# Patient Record
Sex: Female | Born: 1955 | Race: White | Hispanic: No | Marital: Single | Smoking: Never smoker
Health system: Southern US, Community
[De-identification: ages and names within clinical notes are randomized; demographics above are authoritative.]

## PROBLEM LIST (undated history)

## (undated) DIAGNOSIS — M810 Age-related osteoporosis without current pathological fracture: Secondary | ICD-10-CM

## (undated) HISTORY — PX: SKIN CANCER EXCISION: SHX779

## (undated) HISTORY — DX: Age-related osteoporosis without current pathological fracture: M81.0

---

## 2004-03-25 HISTORY — PX: COLONOSCOPY: SHX174

## 2013-04-14 ENCOUNTER — Ambulatory Visit: Payer: Self-pay | Admitting: Physician Assistant

## 2014-11-04 ENCOUNTER — Encounter: Payer: Self-pay | Admitting: Family Medicine

## 2014-11-04 ENCOUNTER — Ambulatory Visit (INDEPENDENT_AMBULATORY_CARE_PROVIDER_SITE_OTHER): Payer: BLUE CROSS/BLUE SHIELD | Admitting: Family Medicine

## 2014-11-04 VITALS — BP 110/64 | HR 76 | Ht 66.0 in | Wt 126.0 lb

## 2014-11-04 DIAGNOSIS — J019 Acute sinusitis, unspecified: Secondary | ICD-10-CM

## 2014-11-04 MED ORDER — FLUCONAZOLE 150 MG PO TABS: 150.0000 mg | ORAL_TABLET | Freq: Once | ORAL | Status: DC

## 2014-11-04 MED ORDER — AZITHROMYCIN 250 MG PO TABS: ORAL_TABLET | ORAL | Status: DC

## 2014-11-04 NOTE — Progress Notes (Addendum)
Name: Heather Sexton   MRN: [id number]    DOB: Jan 30, 1956   Date:11/18/2014       Progress Note  Subjective  Chief Complaint  Chief Complaint  Patient presents with  . Sinusitis    no voice x 1 week, congestion- yellow production. OTC not working    Sinusitis This is a new problem. The current episode started in the past 7 days. The problem has been waxing and waning since onset. There has been no fever. Associated symptoms include congestion, coughing, sinus pressure, a sore throat and swollen glands. Pertinent negatives include no chills, diaphoresis, ear pain, headaches, hoarse voice, neck pain, shortness of breath or sneezing. Treatments tried: antihistamine. The treatment provided no relief.    No problem-specific assessment & plan notes found for this encounter.   Past Medical History  Diagnosis Date  . Osteoporosis     Past Surgical History  Procedure Laterality Date  . Skin cancer excision    . Colonoscopy  2006    Family History  Problem Relation Age of Onset  . Diabetes Father     Social History   Social History  . Marital Status: Single    Spouse Name: N/A  . Number of Children: N/A  . Years of Education: N/A   Occupational History  . Not on file.   Social History Main Topics  . Smoking status: Never Smoker   . Smokeless tobacco: Not on file  . Alcohol Use: No  . Drug Use: No  . Sexual Activity: No   Other Topics Concern  . Not on file   Social History Narrative    Allergies  Allergen Reactions  . Erythromycin Base Rash     Review of Systems  Constitutional: Negative for fever, chills, weight loss, malaise/fatigue and diaphoresis.  HENT: Positive for congestion, sinus pressure and sore throat. Negative for ear discharge, ear pain, hoarse voice and sneezing.   Eyes: Negative for blurred vision.  Respiratory: Positive for cough. Negative for sputum production, shortness of breath and wheezing.   Cardiovascular: Negative for chest  pain, palpitations and leg swelling.  Gastrointestinal: Negative for heartburn, nausea, abdominal pain, diarrhea, constipation, blood in stool and melena.  Genitourinary: Negative for dysuria, urgency, frequency and hematuria.  Musculoskeletal: Negative for myalgias, back pain, joint pain and neck pain.  Skin: Negative for rash.  Neurological: Negative for dizziness, tingling, sensory change, focal weakness and headaches.  Endo/Heme/Allergies: Negative for environmental allergies and polydipsia. Does not bruise/bleed easily.  Psychiatric/Behavioral: Negative for depression and suicidal ideas. The patient is not nervous/anxious and does not have insomnia.      Objective  Filed Vitals:   11/04/14 1445  BP: 110/64  Pulse: 76  Height:  (1.676 m)  Weight: 126 lb (57.153 kg)    Physical Exam  Constitutional: She is well-developed, well-nourished, and in no distress. No distress.  HENT:  Head: Normocephalic and atraumatic.  Right Ear: External ear normal.  Left Ear: External ear normal.  Nose: Nose normal.  Mouth/Throat: Oropharynx is clear and moist.  Eyes: Conjunctivae and EOM are normal. Pupils are equal, round, and reactive to light. Right eye exhibits no discharge. Left eye exhibits no discharge.  Neck: Normal range of motion. Neck supple. No JVD present. No thyromegaly present.  Cardiovascular: Normal rate, regular rhythm, normal heart sounds and intact distal pulses.  Exam reveals no gallop and no friction rub.   No murmur heard. Pulmonary/Chest: Effort normal and breath sounds normal.  Abdominal: Soft. Bowel sounds  are normal. She exhibits no mass. There is no tenderness. There is no guarding.  Musculoskeletal: Normal range of motion. She exhibits no edema.  Lymphadenopathy:    She has no cervical adenopathy.  Neurological: She is alert. She has normal reflexes.  Skin: Skin is warm and dry. She is not diaphoretic.  Psychiatric: Mood and affect normal.       Assessment & Plan  Problem List Items Addressed This Visit    None    Visit Diagnoses    Acute sinusitis, recurrence not specified, unspecified location    -  Primary    suggest sudafed         Dr. Elizabeth Sauer [facility]  11/18/2014

## 2014-11-18 ENCOUNTER — Encounter: Payer: Self-pay | Admitting: Internal Medicine

## 2014-11-18 ENCOUNTER — Encounter: Payer: Self-pay | Admitting: Family Medicine

## 2014-11-18 ENCOUNTER — Ambulatory Visit (INDEPENDENT_AMBULATORY_CARE_PROVIDER_SITE_OTHER): Payer: BLUE CROSS/BLUE SHIELD | Admitting: Family Medicine

## 2014-11-18 VITALS — BP 110/70 | HR 80 | Ht 66.0 in | Wt 126.0 lb

## 2014-11-18 DIAGNOSIS — E559 Vitamin D deficiency, unspecified: Secondary | ICD-10-CM

## 2014-11-18 DIAGNOSIS — R69 Illness, unspecified: Secondary | ICD-10-CM | POA: Diagnosis not present

## 2014-11-18 DIAGNOSIS — M810 Age-related osteoporosis without current pathological fracture: Secondary | ICD-10-CM | POA: Diagnosis not present

## 2014-11-18 MED ORDER — ALENDRONATE SODIUM 70 MG PO TABS: 70.0000 mg | ORAL_TABLET | ORAL | Status: DC

## 2014-11-18 NOTE — Progress Notes (Signed)
Name: Heather Sexton   MRN: [id number]    DOB: May 23, 1955   Date:11/18/2014       Progress Note  Subjective  Chief Complaint  Chief Complaint  Patient presents with  . Osteoporosis    Other This is a chronic problem. The current episode started more than 1 year ago. The problem occurs constantly. The problem has been gradually improving. Pertinent negatives include no abdominal pain, anorexia, arthralgias, change in bowel habit, chest pain, chills, congestion, coughing, diaphoresis, fatigue, fever, headaches, joint swelling, myalgias, nausea, neck pain, numbness, rash, sore throat, swollen glands, urinary symptoms, vertigo, visual change, vomiting or weakness. Associated symptoms comments: Hx of rib fractures. Nothing aggravates the symptoms. Treatments tried: ingectable for osteoporosis/ no signicant bone relacement noted. The treatment provided mild relief.    No problem-specific assessment & plan notes found for this encounter.   Past Medical History  Diagnosis Date  . Osteoporosis     Past Surgical History  Procedure Laterality Date  . Skin cancer excision    . Colonoscopy  2006    Family History  Problem Relation Age of Onset  . Diabetes Father     Social History   Social History  . Marital Status: Single    Spouse Name: N/A  . Number of Children: N/A  . Years of Education: N/A   Occupational History  . Not on file.   Social History Main Topics  . Smoking status: Never Smoker   . Smokeless tobacco: Not on file  . Alcohol Use: No  . Drug Use: No  . Sexual Activity: No   Other Topics Concern  . Not on file   Social History Narrative    Allergies  Allergen Reactions  . Erythromycin Base Rash     Review of Systems  Constitutional: Negative for fever, chills, weight loss, malaise/fatigue, diaphoresis and fatigue.  HENT: Negative for congestion, ear discharge, ear pain and sore throat.   Eyes: Negative for blurred vision.  Respiratory: Negative  for cough, sputum production, shortness of breath and wheezing.   Cardiovascular: Negative for chest pain, palpitations and leg swelling.  Gastrointestinal: Negative for heartburn, nausea, vomiting, abdominal pain, diarrhea, constipation, blood in stool, melena, anorexia and change in bowel habit.  Genitourinary: Negative for dysuria, urgency, frequency and hematuria.  Musculoskeletal: Negative for myalgias, back pain, joint pain, joint swelling, arthralgias and neck pain.  Skin: Negative for rash.  Neurological: Negative for dizziness, vertigo, tingling, sensory change, focal weakness, weakness, numbness and headaches.  Endo/Heme/Allergies: Negative for environmental allergies and polydipsia. Does not bruise/bleed easily.  Psychiatric/Behavioral: Negative for depression and suicidal ideas. The patient is not nervous/anxious and does not have insomnia.      Objective  Filed Vitals:   11/18/14 0931  BP: 110/70  Pulse: 80  Height:  (1.676 m)  Weight: 126 lb (57.153 kg)    Physical Exam  Constitutional: She is well-developed, well-nourished, and in no distress. No distress.  HENT:  Head: Normocephalic and atraumatic.  Right Ear: External ear normal.  Left Ear: External ear normal.  Nose: Nose normal.  Mouth/Throat: Oropharynx is clear and moist.  Eyes: Conjunctivae and EOM are normal. Pupils are equal, round, and reactive to light. Right eye exhibits no discharge. Left eye exhibits no discharge.  Neck: Normal range of motion. Neck supple. No JVD present. No thyromegaly present.  Cardiovascular: Normal rate, regular rhythm, normal heart sounds and intact distal pulses.  Exam reveals no gallop and no friction rub.   No murmur  heard. Pulmonary/Chest: Effort normal and breath sounds normal.  Abdominal: Soft. Bowel sounds are normal. She exhibits no mass. There is no tenderness. There is no guarding.  Musculoskeletal: Normal range of motion. She exhibits no edema.  Lymphadenopathy:     She has no cervical adenopathy.  Neurological: She is alert. She has normal reflexes.  Skin: Skin is warm and dry. She is not diaphoretic.  Psychiatric: Mood and affect normal.      Assessment & Plan  Problem List Items Addressed This Visit      Musculoskeletal and Integument   Postmenopausal osteoporosis - Primary   Relevant Medications   alendronate (FOSAMAX) 70 MG tablet   Other Relevant Orders   Calcium   Vitamin D 1,25 dihydroxy     Other   Taking medication for chronic disease   Relevant Orders   Alkaline phosphatase   Vitamin D deficiency   Relevant Orders   Vitamin D 1,25 dihydroxy        Dr. Elizabeth Sauer [facility]  11/18/2014

## 2014-11-19 LAB — ALKALINE PHOSPHATASE: Alkaline Phosphatase: 86 IU/L (ref 39–117)

## 2014-11-19 LAB — CALCIUM: CALCIUM: 9.1 mg/dL (ref 8.7–10.3)

## 2014-11-24 LAB — VITAMIN D 1,25 DIHYDROXY
VITAMIN D3 1, 25 (OH): 93 pg/mL
Vitamin D 1, 25 (OH)2 Total: 94 pg/mL
Vitamin D2 1, 25 (OH)2: <10 pg/mL

## 2014-12-01 ENCOUNTER — Other Ambulatory Visit: Payer: Self-pay

## 2014-12-01 DIAGNOSIS — M810 Age-related osteoporosis without current pathological fracture: Secondary | ICD-10-CM

## 2014-12-01 MED ORDER — ALENDRONATE SODIUM 70 MG PO TABS
70.0000 mg | ORAL_TABLET | ORAL | Status: DC
Start: 2014-12-01 — End: 2014-12-12

## 2014-12-12 ENCOUNTER — Other Ambulatory Visit: Payer: Self-pay | Admitting: Internal Medicine

## 2014-12-12 DIAGNOSIS — M810 Age-related osteoporosis without current pathological fracture: Secondary | ICD-10-CM

## 2014-12-12 MED ORDER — ALENDRONATE SODIUM 70 MG PO TABS: 70.0000 mg | ORAL_TABLET | ORAL | Status: DC

## 2014-12-16 ENCOUNTER — Encounter: Payer: Self-pay | Admitting: Internal Medicine

## 2014-12-16 ENCOUNTER — Ambulatory Visit (INDEPENDENT_AMBULATORY_CARE_PROVIDER_SITE_OTHER): Payer: BLUE CROSS/BLUE SHIELD | Admitting: Internal Medicine

## 2014-12-16 VITALS — BP 110/62 | HR 80 | Ht 66.0 in | Wt 129.4 lb

## 2014-12-16 DIAGNOSIS — H01006 Unspecified blepharitis left eye, unspecified eyelid: Secondary | ICD-10-CM | POA: Diagnosis not present

## 2014-12-16 DIAGNOSIS — G4700 Insomnia, unspecified: Secondary | ICD-10-CM | POA: Diagnosis not present

## 2014-12-16 DIAGNOSIS — K5909 Other constipation: Secondary | ICD-10-CM | POA: Insufficient documentation

## 2014-12-16 DIAGNOSIS — R310 Gross hematuria: Secondary | ICD-10-CM

## 2014-12-16 DIAGNOSIS — H01003 Unspecified blepharitis right eye, unspecified eyelid: Secondary | ICD-10-CM | POA: Diagnosis not present

## 2014-12-16 DIAGNOSIS — M25579 Pain in unspecified ankle and joints of unspecified foot: Secondary | ICD-10-CM | POA: Insufficient documentation

## 2014-12-16 LAB — POC URINALYSIS WITH MICROSCOPIC (NON AUTO)MANUAL RESULT
Bacteria, UA: 0
Bilirubin, UA: 0
Crystals: 0
EPITHELIAL CELLS, URINE PER MICROSCOPY: 1
GLUCOSE UA: 0
KETONES UA: 0
MUCUS UA: 0
NITRITE UA: 0
PROTEIN UA: 0
RBC: 50 M/uL — AB (ref 4.04–5.48)
Spec Grav, UA: 1.02
UROBILINOGEN UA: 0.2
WBC Casts, UA: 2
pH, UA: 6

## 2014-12-16 MED ORDER — TEMAZEPAM 7.5 MG PO CAPS: 7.5000 mg | ORAL_CAPSULE | Freq: Every evening | ORAL | Status: DC | PRN

## 2014-12-16 NOTE — Progress Notes (Signed)
Date:  12/16/2014   Name:  Heather Sexton   DOB:  [birth year]   MRN:  [id number]   Chief Complaint: Hematuria Hematuria This is a recurrent problem. The current episode started more than 1 month ago. The problem has been waxing and waning since onset. She describes the hematuria as gross hematuria. She reports no clotting in her urine stream. The pain is moderate. She describes her urine color as dark red. Obstructive symptoms include a slower stream. Obstructive symptoms do not include incomplete emptying. Associated symptoms include chills, dysuria and flank pain. Pertinent negatives include no abdominal pain or fever. Her past medical history is significant for kidney stones. (Patient believes she may have had a kidney stone in the past but it was passed spontaneously. She's been seen by urology in [place] probably 5 years ago.)  Insomnia Primary symptoms: sleep disturbance.  The current episode started more than one month. The onset quality is gradual. The symptoms are aggravated by pain (Made worse by her chronic dry eye and blepharitis.).    Review of Systems:  Review of Systems  Constitutional: Positive for chills. Negative for fever.  Eyes: Positive for photophobia and pain.  Gastrointestinal: Negative for abdominal pain and blood in stool.  Genitourinary: Positive for dysuria, hematuria and flank pain. Negative for incomplete emptying.  Musculoskeletal: Positive for back pain. Negative for myalgias and joint swelling.  Psychiatric/Behavioral: Positive for sleep disturbance. The patient has insomnia.     Patient Active Problem List   Diagnosis Date Noted  . Chronic constipation 12/16/2014  . Ankle arthralgia 12/16/2014  . 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 Taking medication for chronic disease 11/18/2014  . Vitamin D deficiency 11/18/2014    Prior to Admission medications   Medication Sig Start Date End Date Taking? Authorizing Provider  alendronate (FOSAMAX) 70 MG  tablet Take 1 tablet (70 mg total) by mouth every 7 (seven) days. Take with a full glass of water on an empty stomach. 12/12/14  Yes Reubin Milan, MD  Multiple Vitamins-Minerals (MULTIVITAMIN WITH MINERALS) tablet Take 1 tablet by mouth daily.   Yes Historical Provider, MD  Omega 3 1000 MG CAPS Take 2 capsules by mouth daily at 6 (six) AM.   Yes Historical Provider, MD  vitamin E 1000 UNIT capsule Take 1,000 Units by mouth daily.   Yes Historical Provider, MD    Allergies  Allergen Reactions  . Erythromycin Base Rash    Past Surgical History  Procedure Laterality Date  . Skin cancer excision    . Colonoscopy  2006    Social History  Substance Use Topics  . Smoking status: Never Smoker   . Smokeless tobacco: None  . Alcohol Use: No     Medication list has been reviewed and updated.  Physical Examination:  Physical Exam  Constitutional: She appears well-developed and well-nourished.  Neck: No thyroid mass present.  Cardiovascular: Normal rate, regular rhythm and normal heart sounds.   Pulmonary/Chest: Effort normal and breath sounds normal.  Abdominal: Soft. Normal appearance and bowel sounds are normal. There is no tenderness. There is no CVA tenderness.  Nursing note and vitals reviewed.   BP 110/62 mmHg  Pulse 80  Ht  (1.676 m)  Wt 129 lb 6.4 oz (58.695 kg)  BMI 20.90 kg/m2  Assessment and Plan: 1. Hematuria, gross Recommend urology evaluation for gross hematuria Drink sufficient fluids - POC urinalysis w microscopic (non auto) - Ambulatory referral to Urology  2. Blepharitis of both eyes Followed by ophthalmology  3. Insomnia Will prescribe low-dose temazepam - temazepam (RESTORIL) 7.5 MG capsule; Take 1 capsule (7.5 mg total) by mouth at bedtime as needed for sleep.  Dispense: 30 capsule; Refill: 0   Heather Edward, MD [facility]  12/16/2014   12/16/2014

## 2014-12-23 ENCOUNTER — Other Ambulatory Visit: Payer: Self-pay | Admitting: Internal Medicine

## 2014-12-23 DIAGNOSIS — R319 Hematuria, unspecified: Secondary | ICD-10-CM | POA: Insufficient documentation

## 2014-12-26 ENCOUNTER — Ambulatory Visit
Admission: RE | Admit: 2014-12-26 | Discharge: 2014-12-26 | Disposition: A | Discharge status: Home / Self Care | Payer: BLUE CROSS/BLUE SHIELD | Source: Ambulatory Visit | Attending: Internal Medicine | Admitting: Internal Medicine

## 2014-12-26 DIAGNOSIS — Z87442 Personal history of urinary calculi: Secondary | ICD-10-CM | POA: Diagnosis present

## 2014-12-26 DIAGNOSIS — R319 Hematuria, unspecified: Secondary | ICD-10-CM | POA: Diagnosis present

## 2015-01-27 ENCOUNTER — Telehealth: Payer: Self-pay

## 2015-01-27 NOTE — Telephone Encounter (Signed)
Patient wanted to get referral for Psych and I made appt on Friday. Jh

## 2015-01-27 NOTE — Telephone Encounter (Signed)
Left VM to inquire about Psychiatrist. I called to see if she would make appt and had to leave a message.

## 2015-02-03 ENCOUNTER — Ambulatory Visit (INDEPENDENT_AMBULATORY_CARE_PROVIDER_SITE_OTHER): Payer: BLUE CROSS/BLUE SHIELD | Admitting: Internal Medicine

## 2015-02-03 ENCOUNTER — Encounter: Payer: Self-pay | Admitting: Internal Medicine

## 2015-02-03 VITALS — BP 108/64 | HR 72 | Ht 66.0 in | Wt 127.4 lb

## 2015-02-03 DIAGNOSIS — F329 Major depressive disorder, single episode, unspecified: Secondary | ICD-10-CM | POA: Diagnosis not present

## 2015-02-03 DIAGNOSIS — J029 Acute pharyngitis, unspecified: Secondary | ICD-10-CM

## 2015-02-03 DIAGNOSIS — H04123 Dry eye syndrome of bilateral lacrimal glands: Secondary | ICD-10-CM

## 2015-02-03 DIAGNOSIS — F32A Depression, unspecified: Secondary | ICD-10-CM

## 2015-02-03 MED ORDER — GABAPENTIN 100 MG PO CAPS: 100.0000 mg | ORAL_CAPSULE | Freq: Every day | ORAL | Status: DC

## 2015-02-03 MED ORDER — DULOXETINE HCL 60 MG PO CPEP: 60.0000 mg | ORAL_CAPSULE | Freq: Every day | ORAL | Status: DC

## 2015-02-03 MED ORDER — AZITHROMYCIN 250 MG PO TABS: ORAL_TABLET | ORAL | Status: DC

## 2015-02-03 NOTE — Progress Notes (Signed)
Date:  02/03/2015   Name:  Heather Sexton   DOB:  [birth year]   MRN:  [id number]   Chief Complaint: No chief complaint on file. Eye Pain  Both eyes are affected.This is a chronic problem. The problem occurs constantly. There was no injury mechanism (chronic dry eye). The pain is moderate (She is struggling with chronic eye pain dryness and crusting. Ophthalmology is trying to treat her but having little success). There is no known exposure to pink eye. Associated symptoms include an eye discharge and eye redness. Pertinent negatives include no fever.  Sore Throat  This is a new problem. The current episode started in the past 7 days. The problem has been gradually worsening. Associated symptoms include coughing, a hoarse voice and swollen glands. Pertinent negatives include no shortness of breath or trouble swallowing. She has tried acetaminophen for the symptoms.  Depression      The patient presents with depression.  This is a new problem.  The onset quality is gradual.   The problem occurs constantly.  The problem has been gradually worsening since onset.  Associated symptoms include fatigue, hopelessness, insomnia, decreased interest and suicidal ideas.     Exacerbated by: due to chronic dry eye and eye pain.  Past treatments include SSRIs - Selective serotonin reuptake inhibitors (took Prozac in the past without much benefit).  Past medical history includes chronic pain, recent illness and depression.      Review of Systems  Constitutional: Positive for fatigue. Negative for fever and chills.  HENT: Positive for hoarse voice, sinus pressure and sore throat. Negative for hearing loss, trouble swallowing and voice change.   Eyes: Positive for pain, discharge, redness and visual disturbance.  Respiratory: Positive for cough. Negative for chest tightness, shortness of breath and wheezing.   Cardiovascular: Negative for chest pain and palpitations.  Psychiatric/Behavioral: Positive for  depression, suicidal ideas, sleep disturbance and dysphoric mood. Negative for hallucinations, self-injury and agitation. The patient has insomnia.     Patient Active Problem List   Diagnosis Date Noted  . Hematuria 12/23/2014  . Chronic constipation 12/16/2014  . Ankle arthralgia 12/16/2014  . 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 Taking medication for chronic disease 11/18/2014  . Vitamin D deficiency 11/18/2014    Prior to Admission medications   Medication Sig Start Date End Date Taking? Authorizing Provider  alendronate (FOSAMAX) 70 MG tablet Take 1 tablet (70 mg total) by mouth every 7 (seven) days. Take with a full glass of water on an empty stomach. 12/12/14  Yes Reubin Milan, MD  Multiple Vitamins-Minerals (MULTIVITAMIN WITH MINERALS) tablet Take 1 tablet by mouth daily.   Yes Historical Provider, MD  Omega 3 1000 MG CAPS Take 2 capsules by mouth daily at 6 (six) AM.   Yes Historical Provider, MD  temazepam (RESTORIL) 7.5 MG capsule Take 1 capsule (7.5 mg total) by mouth at bedtime as needed for sleep. 12/16/14  Yes Reubin Milan, MD  vitamin E 1000 UNIT capsule Take 1,000 Units by mouth daily.   Yes Historical Provider, MD    Allergies  Allergen Reactions  . Erythromycin Base Rash    Past Surgical History  Procedure Laterality Date  . Skin cancer excision    . Colonoscopy  2006    Social History  Substance Use Topics  . Smoking status: Never Smoker   . Smokeless tobacco: None  . Alcohol Use: No     Medication list has been reviewed and updated.   Physical Exam  Constitutional: She appears well-developed and well-nourished. She appears distressed (and tearful).  HENT:  Right Ear: Tympanic membrane and ear canal normal.  Left Ear: Tympanic membrane and ear canal normal.  Nose: Right sinus exhibits maxillary sinus tenderness. Right sinus exhibits no frontal sinus tenderness. Left sinus exhibits maxillary sinus tenderness. Left sinus exhibits no frontal  sinus tenderness.  Eyes: Right conjunctiva is injected. Left conjunctiva is injected.  Sensitive to light and movement  Neck: Normal range of motion. Neck supple. No thyromegaly present.  Pulmonary/Chest: Effort normal and breath sounds normal.  Psychiatric: Her speech is normal and behavior is normal. Thought content normal. Her mood appears anxious. Thought content is not delusional. She exhibits a depressed mood. She expresses no suicidal plans.    BP 108/64 mmHg  Pulse 72  Ht 5\' 6"  (1.676 m)  Wt 127 lb 6.4 oz (57.788 kg)  BMI 20.57 kg/m2  Assessment and Plan: 1. Pharyngitis Tylenol and warm liquids for discomfort - azithromycin (ZITHROMAX Z-PAK) 250 MG tablet; 2 tabs on day #1 then 1 tab daily for 4 days.  Dispense: 6 each; Refill: 0  2. Depression Begin Cymbalta for additional benefit of pain control - DULoxetine (CYMBALTA) 60 MG capsule; Take 1 capsule (60 mg total) by mouth daily.  Dispense: 30 capsule; Refill: 3  3. Chronically dry eyes, bilateral May try gabapentin at bedtime after several days of Cymbalta therapy to demonstrate tolerance - gabapentin (NEURONTIN) 100 MG capsule; Take 1 capsule (100 mg total) by mouth at bedtime.  Dispense: 30 capsule; Refill: 3   Bari EdwardLaura Perkins Molina, MD [facility]  02/03/2015

## 2015-02-06 ENCOUNTER — Other Ambulatory Visit: Payer: Self-pay | Admitting: Internal Medicine

## 2015-02-06 DIAGNOSIS — G4700 Insomnia, unspecified: Secondary | ICD-10-CM

## 2015-02-06 MED ORDER — TEMAZEPAM 7.5 MG PO CAPS: 7.5000 mg | ORAL_CAPSULE | Freq: Every evening | ORAL | Status: DC | PRN

## 2015-03-10 ENCOUNTER — Encounter: Payer: Self-pay | Admitting: Internal Medicine

## 2015-03-10 ENCOUNTER — Ambulatory Visit (INDEPENDENT_AMBULATORY_CARE_PROVIDER_SITE_OTHER): Payer: BLUE CROSS/BLUE SHIELD | Admitting: Internal Medicine

## 2015-03-10 ENCOUNTER — Ambulatory Visit
Admission: RE | Admit: 2015-03-10 | Discharge: 2015-03-10 | Disposition: A | Discharge status: Home / Self Care | Payer: BLUE CROSS/BLUE SHIELD | Source: Ambulatory Visit | Attending: Internal Medicine | Admitting: Internal Medicine

## 2015-03-10 VITALS — BP 104/60 | HR 76 | Ht 66.0 in | Wt 130.2 lb

## 2015-03-10 DIAGNOSIS — K5909 Other constipation: Secondary | ICD-10-CM

## 2015-03-10 DIAGNOSIS — M810 Age-related osteoporosis without current pathological fracture: Secondary | ICD-10-CM

## 2015-03-10 DIAGNOSIS — H04123 Dry eye syndrome of bilateral lacrimal glands: Secondary | ICD-10-CM | POA: Diagnosis not present

## 2015-03-10 DIAGNOSIS — Z0000 Encounter for general adult medical examination without abnormal findings: Secondary | ICD-10-CM

## 2015-03-10 DIAGNOSIS — M419 Scoliosis, unspecified: Secondary | ICD-10-CM

## 2015-03-10 DIAGNOSIS — Z23 Encounter for immunization: Secondary | ICD-10-CM | POA: Diagnosis not present

## 2015-03-10 DIAGNOSIS — F329 Major depressive disorder, single episode, unspecified: Secondary | ICD-10-CM | POA: Diagnosis not present

## 2015-03-10 DIAGNOSIS — M4126 Other idiopathic scoliosis, lumbar region: Secondary | ICD-10-CM | POA: Diagnosis not present

## 2015-03-10 DIAGNOSIS — E559 Vitamin D deficiency, unspecified: Secondary | ICD-10-CM | POA: Diagnosis not present

## 2015-03-10 DIAGNOSIS — H04129 Dry eye syndrome of unspecified lacrimal gland: Secondary | ICD-10-CM | POA: Insufficient documentation

## 2015-03-10 DIAGNOSIS — K5900 Constipation, unspecified: Secondary | ICD-10-CM | POA: Diagnosis not present

## 2015-03-10 DIAGNOSIS — R319 Hematuria, unspecified: Secondary | ICD-10-CM | POA: Diagnosis not present

## 2015-03-10 DIAGNOSIS — F32A Depression, unspecified: Secondary | ICD-10-CM | POA: Insufficient documentation

## 2015-03-10 MED ORDER — MELOXICAM 15 MG PO TABS: 15.0000 mg | ORAL_TABLET | Freq: Every day | ORAL | Status: DC

## 2015-03-10 MED ORDER — METHOCARBAMOL 500 MG PO TABS: 500.0000 mg | ORAL_TABLET | Freq: Two times a day (BID) | ORAL | Status: DC

## 2015-03-10 NOTE — Patient Instructions (Signed)
Breast Self-Awareness Practicing breast self-awareness may pick up problems early, prevent significant medical complications, and possibly save your life. By practicing breast self-awareness, you can become familiar with how your breasts look and feel and if your breasts are changing. This allows you to notice changes early. It can also offer you some reassurance that your breast health is good. One way to learn what is normal for your breasts and whether your breasts are changing is to do a breast self-exam. If you find a lump or something that was not present in the past, it is best to contact your caregiver right away. Other findings that should be evaluated by your caregiver include nipple discharge, especially if it is bloody; skin changes or reddening; areas where the skin seems to be pulled in (retracted); or new lumps and bumps. Breast pain is seldom associated with cancer (malignancy), but should also be evaluated by a caregiver. HOW TO PERFORM A BREAST SELF-EXAM The best time to examine your breasts is 5-7 days after your menstrual period is over. During menstruation, the breasts are lumpier, and it may be more difficult to pick up changes. If you do not menstruate, have reached menopause, or had your uterus removed (hysterectomy), you should examine your breasts at regular intervals, such as monthly. If you are breastfeeding, examine your breasts after a feeding or after using a breast pump. Breast implants do not decrease the risk for lumps or tumors, so continue to perform breast self-exams as recommended. Talk to your caregiver about how to determine the difference between the implant and breast tissue. Also, talk about the amount of pressure you should use during the exam. Over time, you will become more familiar with the variations of your breasts and more comfortable with the exam. A breast self-exam requires you to remove all your clothes above the waist. 1. Look at your breasts and nipples.  Stand in front of a mirror in a room with good lighting. With your hands on your hips, push your hands firmly downward. Look for a difference in shape, contour, and size from one breast to the other (asymmetry). Asymmetry includes puckers, dips, or bumps. Also, look for skin changes, such as reddened or scaly areas on the breasts. Look for nipple changes, such as discharge, dimpling, repositioning, or redness. 2. Carefully feel your breasts. This is best done either in the shower or tub while using soapy water or when flat on your back. Place the arm (on the side of the breast you are examining) above your head. Use the pads (not the fingertips) of your three middle fingers on your opposite hand to feel your breasts. Start in the underarm area and use  inch (2 cm) overlapping circles to feel your breast. Use 3 different levels of pressure (light, medium, and firm pressure) at each circle before moving to the next circle. The light pressure is needed to feel the tissue closest to the skin. The medium pressure will help to feel breast tissue a little deeper, while the firm pressure is needed to feel the tissue close to the ribs. Continue the overlapping circles, moving downward over the breast until you feel your ribs below your breast. Then, move one finger-width towards the center of the body. Continue to use the  inch (2 cm) overlapping circles to feel your breast as you move slowly up toward the collar bone (clavicle) near the base of the neck. Continue the up and down exam using all 3 pressures until you reach the   middle of the chest. Do this with each breast, carefully feeling for lumps or changes. 3.  Keep a written record with breast changes or normal findings for each breast. By writing this information down, you do not need to depend only on memory for size, tenderness, or location. Write down where you are in your menstrual cycle, if you are still menstruating. Breast tissue can have some lumps or  thick tissue. However, see your caregiver if you find anything that concerns you.  SEEK MEDICAL CARE IF:  You see a change in shape, contour, or size of your breasts or nipples.   You see skin changes, such as reddened or scaly areas on the breasts or nipples.   You have an unusual discharge from your nipples.   You feel a new lump or unusually thick areas.    This information is not intended to replace advice given to you by your health care provider. Make sure you discuss any questions you have with your health care provider.   Document Released: 03/11/2005 Document Revised: 02/26/2012 Document Reviewed: 06/26/2011 Elsevier Interactive Patient Education 2016 [employer]  

## 2015-03-10 NOTE — Progress Notes (Signed)
Date:  03/10/2015   Name:  Heather Sexton   DOB:  [birth year]   MRN:  [id number]   Chief Complaint: Annual Exam Heather Sexton is a 59 y.o. female who presents today for her Complete Annual Exam. She feels poorly. She reports exercising none. She reports she is sleeping poorly.   Depression        This is a new problem.  The current episode started 1 to 4 weeks ago.   The onset quality is gradual.   The problem has been gradually improving since onset.  Associated symptoms include fatigue, insomnia and myalgias.  Associated symptoms include no headaches.     Exacerbated by: eye pain and issues.  Past treatments include SNRIs - Serotonin and norepinephrine reuptake inhibitors (cymbalta caused nausea). Insomnia Primary symptoms: sleep disturbance, difficulty falling asleep, frequent awakening.  The problem occurs nightly. Past treatments include medication. The treatment provided moderate relief. PMH includes: depression.  Back Pain This is a new problem. The current episode started more than 1 month ago. The problem occurs every several days. The problem has been waxing and waning since onset. The pain is present in the lumbar spine. The quality of the pain is described as aching and cramping. The pain does not radiate. The pain is moderate. Associated symptoms include pelvic pain. Pertinent negatives include no chest pain, dysuria, fever or headaches.  She was seen by Urology for hematuria and left renal mass on [place].  Workup is in progress with urine cytology.  Urology does not think that her back discomfort is from her kidney. Osteoporosis - developed jaw pain on Fosamax so recently restarted Prolia from Dr. Roseanne Reno at [facility].  She is on calcium and vitamin D.  Vitamin D level was normal in August. Constipation - patient complains of chronic constipation for years. She takes a fiber supplement daily and frequently a laxative in order to have a bowel movement. Has never taken  prescription medication for constipation. She also complains of difficulty with "digestion" which she describes as feeling of fullness after eating a small meal and a sensation of slow stomach emptying afterwards. There is occasional nausea but no vomiting and no significant heartburn.  Review of Systems  Constitutional: Positive for fatigue. Negative for fever, chills, diaphoresis and unexpected weight change.  HENT: Negative for dental problem, ear discharge, hearing loss, tinnitus, trouble swallowing and voice change.   Eyes: Positive for pain.  Respiratory: Negative for chest tightness, shortness of breath and stridor.   Cardiovascular: Negative for chest pain, palpitations and leg swelling.  Gastrointestinal: Positive for nausea, constipation and abdominal distention. Negative for vomiting, diarrhea, blood in stool and rectal pain.  Endocrine: Negative for polydipsia and polyuria.  Genitourinary: Positive for hematuria and pelvic pain. Negative for dysuria, urgency, frequency, vaginal bleeding, vaginal discharge and vaginal pain.  Musculoskeletal: Positive for myalgias and back pain. Negative for joint swelling and neck stiffness.  Skin: Negative for rash.  Neurological: Negative for dizziness, tremors, syncope, speech difficulty, light-headedness and headaches.  Hematological: Negative for adenopathy. Does not bruise/bleed easily.  Psychiatric/Behavioral: Positive for depression, sleep disturbance and dysphoric mood. Negative for confusion. The patient has insomnia. The patient is not nervous/anxious.     Patient Active Problem List   Diagnosis Date Noted  . Hematuria 12/23/2014  . Chronic constipation 12/16/2014  . Ankle arthralgia 12/16/2014  . 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 Taking medication for chronic disease 11/18/2014  . Vitamin D deficiency 11/18/2014    Prior to Admission  medications   Medication Sig Start Date End Date Taking? Authorizing Provider    Cholecalciferol (D-5000) 5000 UNITS TABS Take by mouth.   Yes Historical Provider, MD  denosumab (PROLIA) 60 MG/ML SOLN injection Inject 60 mg into the skin every 6 (six) months. Administer in upper arm, thigh, or abdomen   Yes Historical Provider, MD  gabapentin (NEURONTIN) 100 MG capsule Take 1 capsule (100 mg total) by mouth at bedtime. 02/03/15  Yes Reubin MilanLaura H Berglund, MD  Multiple Vitamins-Minerals (MULTIVITAMIN WITH MINERALS) tablet Take 1 tablet by mouth daily.   Yes Historical Provider, MD  Omega 3 1000 MG CAPS Take 2 capsules by mouth daily at 6 (six) AM.   Yes Historical Provider, MD  temazepam (RESTORIL) 7.5 MG capsule Take 1 capsule (7.5 mg total) by mouth at bedtime as needed for sleep. 02/06/15  Yes Reubin MilanLaura H Berglund, MD  vitamin E 1000 UNIT capsule Take 1,000 Units by mouth daily.   Yes Historical Provider, MD    Allergies  Allergen Reactions  . Erythromycin Base Rash    Past Surgical History  Procedure Laterality Date  . Skin cancer excision    . Colonoscopy  2006    Social History  Substance Use Topics  . Smoking status: Never Smoker   . Smokeless tobacco: None  . Alcohol Use: No    Medication list has been reviewed and updated. Wt Readings from Last 3 Encounters:  03/10/15 130 lb 3.2 oz (59.058 kg)  02/03/15 127 lb 6.4 oz (57.788 kg)  12/16/14 129 lb 6.4 oz (58.695 kg)   Temp Readings from Last 3 Encounters:  No data found for Temp   BP Readings from Last 3 Encounters:  03/10/15 104/60  02/03/15 108/64  12/16/14 110/62   Pulse Readings from Last 3 Encounters:  03/10/15 76  02/03/15 72  12/16/14 80     Physical Exam  Constitutional: She is oriented to person, place, and time. She appears well-developed. She has a sickly appearance. No distress.  HENT:  Head: Normocephalic and atraumatic.  Right Ear: Tympanic membrane and ear canal normal.  Left Ear: Tympanic membrane and ear canal normal.  Eyes: Conjunctivae are normal.  Neck: Carotid bruit is  not present. No thyromegaly present.  Cardiovascular: Normal rate, regular rhythm, normal heart sounds and normal pulses.   Pulmonary/Chest: Effort normal. No respiratory distress. She has no decreased breath sounds. She has no wheezes.  Abdominal: Soft. Bowel sounds are normal. She exhibits no distension. There is no tenderness. There is no rebound and no CVA tenderness.  Musculoskeletal: Normal range of motion.       Right hip: Normal.       Left hip: Normal. She exhibits normal strength (SLR normal).       Arms: Tenderness of paraspinous muscles bilaterally along the lower thoracic and lumbar spine.  No point tenderness along the spine.  Lymphadenopathy:    She has no cervical adenopathy.    She has no axillary adenopathy.  Neurological: She is alert and oriented to person, place, and time. She has normal strength and normal reflexes.  Skin: Skin is warm, dry and intact. No rash noted.  Psychiatric: Her speech is normal and behavior is normal. Thought content normal. Cognition and memory are normal. She exhibits a depressed mood.  Nursing note and vitals reviewed.   BP 104/60 mmHg  Pulse 76  Ht 5\' 6"  (1.676 m)  Wt 130 lb 3.2 oz (59.058 kg)  BMI 21.02 kg/m2  Assessment and Plan: 1.  Annual physical exam Patient declines pelvic exam today Patient declines mammograms - POCT urinalysis dipstick - Lipid panel  2. Flu vaccine need - Flu Vaccine QUAD 36+ mos PF IM (Fluarix & Fluzone Quad PF)  3. Postmenopausal osteoporosis On Prolia with adequate Vitamin D levels  4. Vitamin D deficiency Continue supplementation  5. Dry eye syndrome, bilateral Improving; continue gabapentin at night to aid with sleep  6. Depression Did not tolerate cymbalta Symptoms improving with improvement in eye symptoms  7. Hematuria Work up under way by Dr. Lonna Cobb - CBC with Differential/Platelet  8. Scoliosis of lumbar spine Causing lumbar discomfort related to scoliosis - DG Lumbar Spine  Complete; Future - meloxicam (MOBIC) 15 MG tablet; Take 1 tablet (15 mg total) by mouth daily.  Dispense: 30 tablet; Refill: 0 - methocarbamol (ROBAXIN) 500 MG tablet; Take 1 tablet (500 mg total) by mouth 2 (two) times daily.  Dispense: 60 tablet; Refill: 2  9. Chronic constipation Samples of Amitiza 24 mcg bid given - Comprehensive metabolic panel - TSH   Bari Edward, MD [facility]  03/10/2015

## 2015-03-11 LAB — LIPID PANEL
CHOL/HDL RATIO: 3.4 ratio (ref 0.0–4.4)
Cholesterol, Total: 210 mg/dL — ABNORMAL HIGH (ref 100–199)
HDL: 61 mg/dL (ref >39)
LDL CALC: 130 mg/dL — AB (ref 0–99)
Triglycerides: 96 mg/dL (ref 0–149)
VLDL Cholesterol Cal: 19 mg/dL (ref 5–40)

## 2015-03-11 LAB — CBC WITH DIFFERENTIAL/PLATELET
BASOS: 1 %
Basophils Absolute: 0 10*3/uL (ref 0.0–0.2)
EOS (ABSOLUTE): 0.3 10*3/uL (ref 0.0–0.4)
Eos: 5 %
Hematocrit: 39.6 % (ref 34.0–46.6)
Hemoglobin: 12.7 g/dL (ref 11.1–15.9)
IMMATURE GRANULOCYTES: 0 %
Immature Grans (Abs): 0 10*3/uL (ref 0.0–0.1)
LYMPHS ABS: 1.9 10*3/uL (ref 0.7–3.1)
Lymphs: 38 %
MCH: 28.4 pg (ref 26.6–33.0)
MCHC: 32.1 g/dL (ref 31.5–35.7)
MCV: 89 fL (ref 79–97)
MONOS ABS: 0.4 10*3/uL (ref 0.1–0.9)
Monocytes: 7 %
NEUTROS PCT: 49 %
Neutrophils Absolute: 2.5 10*3/uL (ref 1.4–7.0)
PLATELETS: 283 10*3/uL (ref 150–379)
RBC: 4.47 x10E6/uL (ref 3.77–5.28)
RDW: 14.1 % (ref 12.3–15.4)
WBC: 5.1 10*3/uL (ref 3.4–10.8)

## 2015-03-11 LAB — COMPREHENSIVE METABOLIC PANEL
A/G RATIO: 2.2 (ref 1.1–2.5)
ALT: 11 IU/L (ref 0–32)
AST: 18 IU/L (ref 0–40)
Albumin: 4.3 g/dL (ref 3.6–4.8)
Alkaline Phosphatase: 52 IU/L (ref 39–117)
BUN/Creatinine Ratio: 27 — ABNORMAL HIGH (ref 11–26)
BUN: 16 mg/dL (ref 8–27)
CO2: 25 mmol/L (ref 18–29)
CREATININE: 0.59 mg/dL (ref 0.57–1.00)
Calcium: 9.3 mg/dL (ref 8.7–10.3)
Chloride: 101 mmol/L (ref 96–106)
GFR calc Af Amer: 114 mL/min/{1.73_m2} (ref >59)
GFR, EST NON AFRICAN AMERICAN: 99 mL/min/{1.73_m2} (ref >59)
GLUCOSE: 82 mg/dL (ref 65–99)
Globulin, Total: 2 g/dL (ref 1.5–4.5)
Potassium: 4.3 mmol/L (ref 3.5–5.2)
SODIUM: 140 mmol/L (ref 134–144)
Total Protein: 6.3 g/dL (ref 6.0–8.5)

## 2015-03-11 LAB — TSH: TSH: 2.51 u[IU]/mL (ref 0.450–4.500)

## 2015-03-13 ENCOUNTER — Ambulatory Visit: Admission: RE | Admit: 2015-03-13 | Payer: BLUE CROSS/BLUE SHIELD | Source: Ambulatory Visit

## 2015-03-15 ENCOUNTER — Ambulatory Visit
Admission: RE | Admit: 2015-03-15 | Discharge: 2015-03-15 | Disposition: A | Discharge status: Home / Self Care | Payer: BLUE CROSS/BLUE SHIELD | Source: Ambulatory Visit | Attending: Internal Medicine | Admitting: Internal Medicine

## 2015-03-15 DIAGNOSIS — M4126 Other idiopathic scoliosis, lumbar region: Secondary | ICD-10-CM | POA: Insufficient documentation

## 2015-04-11 ENCOUNTER — Other Ambulatory Visit: Payer: Self-pay | Admitting: Internal Medicine

## 2015-04-11 DIAGNOSIS — H04123 Dry eye syndrome of bilateral lacrimal glands: Secondary | ICD-10-CM

## 2015-04-11 MED ORDER — GABAPENTIN 100 MG PO CAPS: 100.0000 mg | ORAL_CAPSULE | Freq: Every day | ORAL | Status: DC

## 2015-07-26 ENCOUNTER — Telehealth: Payer: Self-pay

## 2015-07-26 NOTE — Telephone Encounter (Signed)
Heather Oilerobert Stewart no longer on her plan for Prolia injections. Can we do them?

## 2015-07-28 NOTE — Telephone Encounter (Signed)
Patient will call ins and see if we can call this in. [facility]

## 2015-07-28 NOTE — Telephone Encounter (Signed)
If she can pick up the syringe from the pharmacy and bring it here, we can administer it.

## 2015-07-28 NOTE — Telephone Encounter (Signed)
Left Message

## 2015-08-05 ENCOUNTER — Ambulatory Visit
Admission: EM | Admit: 2015-08-05 | Discharge: 2015-08-05 | Disposition: A | Discharge status: Home / Self Care | Payer: BLUE CROSS/BLUE SHIELD | Attending: Family Medicine | Admitting: Family Medicine

## 2015-08-05 ENCOUNTER — Encounter: Payer: Self-pay | Admitting: *Deleted

## 2015-08-05 DIAGNOSIS — H6593 Unspecified nonsuppurative otitis media, bilateral: Secondary | ICD-10-CM

## 2015-08-05 DIAGNOSIS — J0101 Acute recurrent maxillary sinusitis: Secondary | ICD-10-CM | POA: Diagnosis not present

## 2015-08-05 DIAGNOSIS — J301 Allergic rhinitis due to pollen: Secondary | ICD-10-CM

## 2015-08-05 MED ORDER — FLUCONAZOLE 150 MG PO TABS: 150.0000 mg | ORAL_TABLET | Freq: Every day | ORAL | Status: AC

## 2015-08-05 MED ORDER — SALINE SPRAY 0.65 % NA SOLN: 2.0000 | NASAL | Status: AC

## 2015-08-05 MED ORDER — AZITHROMYCIN 250 MG PO TABS: 250.0000 mg | ORAL_TABLET | Freq: Every day | ORAL | Status: DC

## 2015-08-05 NOTE — Discharge Instructions (Signed)
Sinus Rinse WHAT IS A SINUS RINSE? A sinus rinse is a simple home treatment that is used to rinse your sinuses with a sterile mixture of salt and water (saline solution). Sinuses are air-filled spaces in your skull behind the bones of your face and forehead that open into your nasal cavity. You will use the following:  Saline solution.  Neti pot or spray bottle. This releases the saline solution into your nose and through your sinuses. Neti pots and spray bottles can be purchased at [employer], a health food store, or online. WHEN WOULD I DO A SINUS RINSE? A sinus rinse can help to clear mucus, dirt, dust, or pollen from the nasal cavity. You may do a sinus rinse when you have a cold, a virus, nasal allergy symptoms, a sinus infection, or stuffiness in the nose or sinuses. If you are considering a sinus rinse:  Ask your child's health care provider before performing a sinus rinse on your child.  Do not do a sinus rinse if you have had ear or nasal surgery, ear infection, or blocked ears. HOW DO I DO A SINUS RINSE?  Wash your hands.  Disinfect your device according to the directions provided and then dry it.  Use the solution that comes with your device or one that is sold separately in stores. Follow the mixing directions on the package.  Fill your device with the amount of saline solution as directed by the device instructions.  Stand over a sink and tilt your head sideways over the sink.  Place the spout of the device in your upper nostril (the one closer to the ceiling).  Gently pour or squeeze the saline solution into the nasal cavity. The liquid should drain to the lower nostril if you are not overly congested.  Gently blow your nose. Blowing too hard may cause ear pain.  Repeat in the other nostril.  Clean and rinse your device with clean water and then air-dry it. ARE THERE RISKS OF A SINUS RINSE?  Sinus rinse is generally very safe and effective. However, there  are a few risks, which include:   A burning sensation in the sinuses. This may happen if you do not make the saline solution as directed. Make sure to follow all directions when making the saline solution.  Infection from contaminated water. This is rare, but possible.  Nasal irritation.   This information is not intended to replace advice given to you by your health care provider. Make sure you discuss any questions you have with your health care provider.   Document Released: 10/06/2013 Document Reviewed: 10/06/2013 Elsevier Interactive Patient Education 2016 [employer].  Sinusitis, Adult Sinusitis is redness, soreness, and inflammation of the paranasal sinuses. Paranasal sinuses are air pockets within the bones of your face. They are located beneath your eyes, in the middle of your forehead, and above your eyes. In healthy paranasal sinuses, mucus is able to drain out, and air is able to circulate through them by way of your nose. However, when your paranasal sinuses are inflamed, mucus and air can become trapped. This can allow bacteria and other germs to grow and cause infection. Sinusitis can develop quickly and last only a short time (acute) or continue over a long period (chronic). Sinusitis that lasts for more than 12 weeks is considered chronic. CAUSES Causes of sinusitis include:  Allergies.  Structural abnormalities, such as displacement of the cartilage that separates your nostrils (deviated septum), which can decrease the air flow  through your nose and sinuses and affect sinus drainage.  Functional abnormalities, such as when the small hairs (cilia) that line your sinuses and help remove mucus do not work properly or are not present. SIGNS AND SYMPTOMS Symptoms of acute and chronic sinusitis are the same. The primary symptoms are pain and pressure around the affected sinuses. Other symptoms include:  Upper toothache.  Earache.  Headache.  Bad breath.  Decreased  sense of smell and taste.  A cough, which worsens when you are lying flat.  Fatigue.  Fever.  Thick drainage from your nose, which often is green and may contain pus (purulent).  Swelling and warmth over the affected sinuses. DIAGNOSIS Your health care provider will perform a physical exam. During your exam, your health care provider may perform any of the following to help determine if you have acute sinusitis or chronic sinusitis:  Look in your nose for signs of abnormal growths in your nostrils (nasal polyps).  Tap over the affected sinus to check for signs of infection.  View the inside of your sinuses using an imaging device that has a light attached (endoscope). If your health care provider suspects that you have chronic sinusitis, one or more of the following tests may be recommended:  Allergy tests.  Nasal culture. A sample of mucus is taken from your nose, sent to a lab, and screened for bacteria.  Nasal cytology. A sample of mucus is taken from your nose and examined by your health care provider to determine if your sinusitis is related to an allergy. TREATMENT Most cases of acute sinusitis are related to a viral infection and will resolve on their own within 10 days. Sometimes, medicines are prescribed to help relieve symptoms of both acute and chronic sinusitis. These may include pain medicines, decongestants, nasal steroid sprays, or saline sprays. However, for sinusitis related to a bacterial infection, your health care provider will prescribe antibiotic medicines. These are medicines that will help kill the bacteria causing the infection. Rarely, sinusitis is caused by a fungal infection. In these cases, your health care provider will prescribe antifungal medicine. For some cases of chronic sinusitis, surgery is needed. Generally, these are cases in which sinusitis recurs more than 3 times per year, despite other treatments. HOME CARE INSTRUCTIONS  Drink plenty of  water. Water helps thin the mucus so your sinuses can drain more easily.  Use a humidifier.  Inhale steam 3-4 times a day (for example, sit in the bathroom with the shower running).  Apply a warm, moist washcloth to your face 3-4 times a day, or as directed by your health care provider.  Use saline nasal sprays to help moisten and clean your sinuses.  Take medicines only as directed by your health care provider.  If you were prescribed either an antibiotic or antifungal medicine, finish it all even if you start to feel better. SEEK IMMEDIATE MEDICAL CARE IF:  You have increasing pain or severe headaches.  You have nausea, vomiting, or drowsiness.  You have swelling around your face.  You have vision problems.  You have a stiff neck.  You have difficulty breathing.   This information is not intended to replace advice given to you by your health care provider. Make sure you discuss any questions you have with your health care provider.   Document Released: 03/11/2005 Document Revised: 04/01/2014 Document Reviewed: 03/26/2011 Elsevier Interactive Patient Education 2016 Elsevier Inc. Otitis Media With Effusion Otitis media with effusion is the presence of fluid in  the middle ear. This is a common problem in children, which often follows ear infections. It may be present for weeks or longer after the infection. Unlike an acute ear infection, otitis media with effusion refers only to fluid behind the ear drum and not infection. Children with repeated ear and sinus infections and allergy problems are the most likely to get otitis media with effusion. CAUSES  The most frequent cause of the fluid buildup is dysfunction of the eustachian tubes. These are the tubes that drain fluid in the ears to the back of the nose (nasopharynx). SYMPTOMS   The main symptom of this condition is hearing loss. As a result, you or your child may:  Listen to the TV at a loud volume.  Not respond to  questions.  Ask "what" often when spoken to.  Mistake or confuse one sound or word for another.  There may be a sensation of fullness or pressure but usually not pain. DIAGNOSIS   Your health care provider will diagnose this condition by examining you or your child's ears.  Your health care provider may test the pressure in you or your child's ear with a tympanometer.  A hearing test may be conducted if the problem persists. TREATMENT   Treatment depends on the duration and the effects of the effusion.  Antibiotics, decongestants, nose drops, and cortisone-type drugs (tablets or nasal spray) may not be helpful.  Children with persistent ear effusions may have delayed language or behavioral problems. Children at risk for developmental delays in hearing, learning, and speech may require referral to a specialist earlier than children not at risk.  You or your child's health care provider may suggest a referral to an ear, nose, and throat surgeon for treatment. The following may help restore normal hearing:  Drainage of fluid.  Placement of ear tubes (tympanostomy tubes).  Removal of adenoids (adenoidectomy). HOME CARE INSTRUCTIONS   Avoid secondhand smoke.  Infants who are breastfed are less likely to have this condition.  Avoid feeding infants while they are lying flat.  Avoid known environmental allergens.  Avoid people who are sick. SEEK MEDICAL CARE IF:   Hearing is not better in 3 months.  Hearing is worse.  Ear pain.  Drainage from the ear.  Dizziness. MAKE SURE YOU:   Understand these instructions.  Will watch your condition.  Will get help right away if you are not doing well or get worse.   This information is not intended to replace advice given to you by your health care provider. Make sure you discuss any questions you have with your health care provider.   Document Released: 04/18/2004 Document Revised: 04/01/2014 Document Reviewed:  10/06/2012 Elsevier Interactive Patient Education [employer]

## 2015-08-05 NOTE — ED Notes (Signed)
Pt states that she has headache, sinus pressure, sore throat and cough  Started about 2 weeks ago

## 2015-08-05 NOTE — ED Provider Notes (Signed)
CSN: [id number]     Arrival date & time 08/05/15  0805 History   First MD Initiated Contact with Patient 08/05/15 [phone]     Chief Complaint  Patient presents with  . Facial Pain  . Headache   (Consider location/radiation/quality/duration/timing/severity/associated sxs/prior Treatment) HPI Comments: Single caucasian female here for recurrent sinus infection last infection spring 2016 typically gets it a couple weeks after allergy season starts.  Cannot use flonase or decongestants due to eye condition.  Has used azithromycin with good relief in the past.  Reports gets vaginal yeast infections with antibiotic use.  Cervical lymph nodes tender/hurt per patient.  Patient is a 60 y.o. female presenting with headaches. The history is provided by the patient.  Headache Pain location:  Frontal Quality:  Dull Radiates to:  Does not radiate Severity currently:  5/10 Severity at highest:  5/10 Onset quality:  Gradual Duration:  2 weeks Timing:  Constant Progression:  Worsening Chronicity:  Recurrent Similar to prior headaches: yes   Context: coughing and exposure to cold air   Context: not activity, not exposure to bright light, not caffeine, not defecating, not eating, not stress, not intercourse, not loud noise and not straining   Relieved by:  Nothing Worsened by:  Activity Ineffective treatments:  Resting in a darkened room Associated symptoms: congestion, ear pain, facial pain, sinus pressure, sore throat, swollen glands and URI   Associated symptoms: no abdominal pain, no back pain, no blurred vision, no cough, no diarrhea, no dizziness, no drainage, no eye pain, no fatigue, no fever, no focal weakness, no hearing loss, no loss of balance, no myalgias, no nausea, no near-syncope, no neck pain, no neck stiffness, no numbness, no paresthesias, no photophobia, no seizures, no syncope, no tingling, no visual change, no vomiting and no weakness     Past Medical History  Diagnosis Date  .  Osteoporosis    Past Surgical History  Procedure Laterality Date  . Skin cancer excision    . Colonoscopy  2006   Family History  Problem Relation Age of Onset  . Diabetes Father    Social History  Substance Use Topics  . Smoking status: Never Smoker   . Smokeless tobacco: None  . Alcohol Use: No   OB History    No data available     Review of Systems  Constitutional: Negative for fever, chills, diaphoresis, activity change, appetite change, fatigue and unexpected weight change.  HENT: Positive for congestion, ear pain, sinus pressure and sore throat. Negative for dental problem, drooling, ear discharge, facial swelling, hearing loss, mouth sores, nosebleeds, postnasal drip, rhinorrhea, sneezing, tinnitus, trouble swallowing and voice change.   Eyes: Negative for blurred vision, photophobia, pain, discharge, redness, itching and visual disturbance.  Respiratory: Negative for cough, choking, chest tightness, shortness of breath, wheezing and stridor.   Cardiovascular: Negative for chest pain, palpitations, leg swelling, syncope and near-syncope.  Gastrointestinal: Negative for nausea, vomiting, abdominal pain, diarrhea, constipation, blood in stool and abdominal distention.  Endocrine: Negative for cold intolerance and heat intolerance.  Genitourinary: Negative for dysuria, hematuria and difficulty urinating.  Musculoskeletal: Negative for myalgias, back pain, joint swelling, arthralgias, gait problem, neck pain and neck stiffness.  Skin: Negative for color change, pallor, rash and wound.  Allergic/Immunologic: Positive for environmental allergies. Negative for food allergies.  Neurological: Positive for headaches. Negative for dizziness, tremors, focal weakness, seizures, syncope, facial asymmetry, speech difficulty, weakness, light-headedness, numbness, paresthesias and loss of balance.  Hematological: Negative for adenopathy. Does not bruise/bleed easily.  Psychiatric/Behavioral:  Negative for behavioral problems, confusion, sleep disturbance and agitation.    Allergies  Erythromycin base  Home Medications   Prior to Admission medications   Medication Sig Start Date End Date Taking? Authorizing Provider  azithromycin (ZITHROMAX) 250 MG tablet Take 1 tablet (250 mg total) by mouth daily. Take first 2 tablets together, then 1 every day until finished. 08/05/15   Barbaraann Barthel, NP  Cholecalciferol (D-5000) 5000 UNITS TABS Take by mouth.    Historical Provider, MD  denosumab (PROLIA) 60 MG/ML SOLN injection Inject 60 mg into the skin every 6 (six) months. Administer in upper arm, thigh, or abdomen    Historical Provider, MD  fluconazole (DIFLUCAN) 150 MG tablet Take 1 tablet (150 mg total) by mouth daily. 08/12/15 08/13/15  Barbaraann Barthel, NP  Multiple Vitamins-Minerals (MULTIVITAMIN WITH MINERALS) tablet Take 1 tablet by mouth daily.    Historical Provider, MD  Omega 3 1000 MG CAPS Take 2 capsules by mouth daily at 6 (six) AM.    Historical Provider, MD  sodium chloride (OCEAN) 0.65 % SOLN nasal spray Place 2 sprays into both nostrils every 2 (two) hours while awake. 08/05/15   Barbaraann Barthel, NP  vitamin E 1000 UNIT capsule Take 1,000 Units by mouth daily.    Historical Provider, MD   Meds Ordered and Administered this Visit  Medications - No data to display  BP 122/54 mmHg  Pulse 61  Temp(Src) 98 F (36.7 C) (Oral)  Ht 5\' 6"  (1.676 m)  Wt 130 lb (58.968 kg)  BMI 20.99 kg/m2  SpO2 100% No data found.   Physical Exam  Constitutional: She is oriented to person, place, and time. Vital signs are normal. She appears well-developed and well-nourished. She is active and cooperative.  Non-toxic appearance. She does not have a sickly appearance. She appears ill. No distress.  HENT:  Head: Normocephalic and atraumatic.  Right Ear: Hearing, external ear and ear canal normal. A middle ear effusion is present.  Left Ear: Hearing, external ear and ear canal normal.  A middle ear effusion is present.  Nose: Mucosal edema and rhinorrhea present. No nose lacerations, sinus tenderness, nasal deformity, septal deviation or nasal septal hematoma. No epistaxis.  No foreign bodies. Right sinus exhibits maxillary sinus tenderness and frontal sinus tenderness. Left sinus exhibits maxillary sinus tenderness and frontal sinus tenderness.  Mouth/Throat: Uvula is midline and mucous membranes are normal. Mucous membranes are not pale, not dry and not cyanotic. She does not have dentures. No oral lesions. No trismus in the jaw. Normal dentition. No dental abscesses, uvula swelling, lacerations or dental caries. Posterior oropharyngeal edema and posterior oropharyngeal erythema present. No oropharyngeal exudate or tonsillar abscesses.  Cobblestoning posterior pharynx; bilateral TMs with air fluid level clear; nasal turbinates edema/erythema/clear discharge; bilateral allergic shiners  Eyes: Conjunctivae, EOM and lids are normal. Pupils are equal, round, and reactive to light. Right eye exhibits no chemosis, no discharge, no exudate and no hordeolum. No foreign body present in the right eye. Left eye exhibits no chemosis, no discharge, no exudate and no hordeolum. No foreign body present in the left eye. Right conjunctiva is not injected. Right conjunctiva has no hemorrhage. Left conjunctiva is not injected. Left conjunctiva has no hemorrhage. No scleral icterus. Right eye exhibits normal extraocular motion and no nystagmus. Left eye exhibits normal extraocular motion and no nystagmus. Right pupil is round and reactive. Left pupil is round and reactive. Pupils are equal.  Neck: Trachea normal and normal range  of motion. Neck supple. No tracheal tenderness, no spinous process tenderness and no muscular tenderness present. No rigidity. No tracheal deviation, no edema, no erythema and normal range of motion present. No thyroid mass and no thyromegaly present.  Cardiovascular: Normal rate,  regular rhythm, S1 normal, S2 normal, normal heart sounds and intact distal pulses.  PMI is not displaced.  Exam reveals no gallop and no friction rub.   No murmur heard. Pulmonary/Chest: Effort normal and breath sounds normal. No accessory muscle usage or stridor. No respiratory distress. She has no decreased breath sounds. She has no wheezes. She has no rhonchi. She has no rales. She exhibits no tenderness.  Abdominal: Soft. She exhibits no distension.  Musculoskeletal: Normal range of motion. She exhibits no edema or tenderness.       Right shoulder: Normal.       Left shoulder: Normal.       Right hip: Normal.       Left hip: Normal.       Right knee: Normal.       Left knee: Normal.       Cervical back: Normal.       Right hand: Normal.       Left hand: Normal.  Lymphadenopathy:       Head (right side): No submental, no submandibular, no tonsillar, no preauricular, no posterior auricular and no occipital adenopathy present.       Head (left side): No submental, no submandibular, no tonsillar, no preauricular, no posterior auricular and no occipital adenopathy present.    She has no cervical adenopathy.       Right cervical: No superficial cervical, no deep cervical and no posterior cervical adenopathy present.      Left cervical: No superficial cervical, no deep cervical and no posterior cervical adenopathy present.  Neurological: She is alert and oriented to person, place, and time. She has normal strength. She is not disoriented. She displays no atrophy and no tremor. No cranial nerve deficit or sensory deficit. She exhibits normal muscle tone. She displays no seizure activity. Coordination and gait normal. GCS eye subscore is 4. GCS verbal subscore is 5. GCS motor subscore is 6.  Skin: Skin is warm, dry and intact. No abrasion, no bruising, no burn, no ecchymosis, no laceration, no lesion, no petechiae and no rash noted. She is not diaphoretic. No cyanosis or erythema. No pallor. Nails  show no clubbing.  Psychiatric: She has a normal mood and affect. Her speech is normal and behavior is normal. Judgment and thought content normal. Cognition and memory are normal.  Nursing note and vitals reviewed.   ED Course  Procedures (including critical care time)  Labs Review Labs Reviewed - No data to display  Imaging Review No results found.      MDM   1. Acute recurrent maxillary sinusitis   2. Otitis media with effusion, bilateral   3. Seasonal allergic rhinitis due to pollen    Patient may use normal saline nasal spray as needed.  Consider antihistamine or nasal steroid use.  Avoid triggers if possible.  Shower prior to bedtime if exposed to triggers.  If allergic dust/dust mites recommend mattress/pillow covers/encasements; washing linens, vacuuming, sweeping, dusting weekly.  Call or return to clinic as needed if these symptoms worsen or fail to improve as anticipated.   Exitcare handout on allergic rhinitis given to patient.  Patient verbalized understanding of instructions, agreed with plan of care and had no further questions at this time.  P2:  Avoidance and hand washing.  Refused flonase; saline 2 sprays each nostril q2h prn congestion.  If no improvement with 48 hours of saline start azithromycin 500mg  po day 1 250mg  days 2-5.  Patient has previously taken without sidde effects and good relief of sinus infection last year.  Patient refused vaginal metronidazole requested Rx for diflucan po prn yeast infection frequent with antibiotic use.  Discussed possible cardiac arrhythmias with azithromycin and diflucan only take if absolutely necessary.  Discussed long half life of azithromycin with patient.  Patient asked if erythromycin allergy testing available and discussed to contact her insurance network provider allergist to see if they perform skin/genetic or serum testing.  Rx given.  No evidence of systemic bacterial infection, non toxic and well hydrated.  I do not see  where any further testing or imaging is necessary at this time.   I will suggest supportive care, rest, good hygiene and encourage the patient to take adequate fluids.  The patient is to return to clinic or EMERGENCY ROOM if symptoms worsen or change significantly.  Exitcare handout on sinusitis given to patient.  Patient verbalized agreement and understanding of treatment plan and had no further questions at this time.   P2:  Hand washing and cover cough  Supportive treatment.   No evidence of invasive bacterial infection, non toxic and well hydrated.  This is most likely self limiting viral infection.  I do not see where any further testing or imaging is necessary at this time.   I will suggest supportive care, rest, good hygiene and encourage the patient to take adequate fluids.  The patient is to return to clinic or EMERGENCY ROOM if symptoms worsen or change significantly e.g. ear pain, fever, purulent discharge from ears or bleeding.  Exitcare handout on otitis media with effusion given to patient.  Patient verbalized agreement and understanding of treatment plan.      Barbaraann Barthel, NP 08/05/15 [phone]

## 2015-08-11 ENCOUNTER — Encounter: Payer: Self-pay | Admitting: Emergency Medicine

## 2015-08-11 ENCOUNTER — Ambulatory Visit
Admission: EM | Admit: 2015-08-11 | Discharge: 2015-08-11 | Disposition: A | Discharge status: Home / Self Care | Payer: BLUE CROSS/BLUE SHIELD | Attending: Family Medicine | Admitting: Family Medicine

## 2015-08-11 ENCOUNTER — Ambulatory Visit (INDEPENDENT_AMBULATORY_CARE_PROVIDER_SITE_OTHER): Payer: BLUE CROSS/BLUE SHIELD

## 2015-08-11 ENCOUNTER — Ambulatory Visit: Payer: BLUE CROSS/BLUE SHIELD

## 2015-08-11 DIAGNOSIS — S9032XA Contusion of left foot, initial encounter: Secondary | ICD-10-CM | POA: Diagnosis not present

## 2015-08-11 MED ORDER — TRAMADOL HCL 50 MG PO TABS: 50.0000 mg | ORAL_TABLET | Freq: Three times a day (TID) | ORAL | Status: DC | PRN

## 2015-08-11 NOTE — Discharge Instructions (Signed)
Take medication as prescribed. Rest. Elevate and apply ice.   Follow up with orthopedic as needed for continued pain.   Follow up with your primary care physician this week as needed. Return to Urgent care for new or worsening concerns.    Foot Contusion A foot contusion is a deep bruise to the foot. Contusions are the result of an injury that caused bleeding under the skin. The contusion may turn blue, purple, or yellow. Minor injuries will give you a painless contusion, but more severe contusions may stay painful and swollen for a few weeks. CAUSES  A foot contusion comes from a direct blow to that area, such as a heavy object falling on the foot. SYMPTOMS   Swelling of the foot.  Discoloration of the foot.  Tenderness or soreness of the foot. DIAGNOSIS  You will have a physical exam and will be asked about your history. You may need an X-ray of your foot to look for a broken bone (fracture).  TREATMENT  An elastic wrap may be recommended to support your foot. Resting, elevating, and applying cold compresses to your foot are often the best treatments for a foot contusion. Over-the-counter medicines may also be recommended for pain control. HOME CARE INSTRUCTIONS   Put ice on the injured area.  Put ice in a plastic bag.  Place a towel between your skin and the bag.  Leave the ice on for 15-20 minutes, 03-04 times a day.  Only take over-the-counter or prescription medicines for pain, discomfort, or fever as directed by your caregiver.  If told, use an elastic wrap as directed. This can help reduce swelling. You may remove the wrap for sleeping, showering, and bathing. If your toes become numb, cold, or blue, take the wrap off and reapply it more loosely.  Elevate your foot with pillows to reduce swelling.  Try to avoid standing or walking while the foot is painful. Do not resume use until instructed by your caregiver. Then, begin use gradually. If pain develops, decrease use.  Gradually increase activities that do not cause discomfort until you have normal use of your foot.  See your caregiver as directed. It is very important to keep all follow-up appointments in order to avoid any lasting problems with your foot, including long-term (chronic) pain. SEEK IMMEDIATE MEDICAL CARE IF:   You have increased redness, swelling, or pain in your foot.  Your swelling or pain is not relieved with medicines.  You have loss of feeling in your foot or are unable to move your toes.  Your foot turns cold or blue.  You have pain when you move your toes.  Your foot becomes warm to the touch.  Your contusion does not improve in 2 days. MAKE SURE YOU:   Understand these instructions.  Will watch your condition.  Will get help right away if you are not doing well or get worse.   This information is not intended to replace advice given to you by your health care provider. Make sure you discuss any questions you have with your health care provider.   Document Released: 12/31/2005 Document Revised: 09/10/2011 Document Reviewed: 11/15/2014 Elsevier Interactive Patient Education [employer]2016 [employer]

## 2015-08-11 NOTE — ED Provider Notes (Signed)
[facility]  ____________________________________________  Time seen: Approximately 9:55 AM  I have reviewed the triage vital signs and the nursing notes.   HISTORY  Chief Complaint Foot Pain and Ankle Pain   HPI Heather Sexton. female presents with a complaint of left foot pain. Patient reports last night she was getting in her car and states that in the process of adjusting her purse the car door closed on her left foot causing pain, while sitting in the seat. Denies any fall or other injury. Denies head injury or loss of consciousness. States pain to left foot since. States continues to ambulate but with pain. States some throbbing pain at rest as well. States did take over-the-counter Excedrin last night which helped some. Denies pain radiation. Denies any numbness or tingling sensation. Again denies any other injury. Reports has a history of osteoporosis and when she has a extremity injury she is worried about a fracture which is what prompted her to be evaluated today.  Denies fevers. Denies chest pain, shortness of breath, chest pain with deep breath. Denies dizziness, weakness, abdominal pain, dysuria, neck pain, back pain, weakness, extremity pain, extremity swelling or other complaints.      Past Medical History  Diagnosis Date  . Osteoporosis     Patient Active Problem List   Diagnosis Date Noted  . Dry eye syndrome 03/10/2015  . Depression 03/10/2015  . Scoliosis of lumbar spine 03/10/2015  . Hematuria 12/23/2014  . Chronic constipation 12/16/2014  . Ankle arthralgia 12/16/2014  . 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 Taking medication for chronic disease 11/18/2014  . Vitamin D deficiency 11/18/2014    Past Surgical History  Procedure Laterality Date  . Skin cancer excision    . Colonoscopy  2006    Current Outpatient Rx  Name  Route  Sig  Dispense  Refill  . doxycycline (MONODOX) 50 MG capsule   Oral   Take 50 mg by mouth  2 (two) times daily.         .           . Cholecalciferol (D-5000) 5000 UNITS TABS   Oral   Take by mouth.         . denosumab (PROLIA) 60 MG/ML SOLN injection   Subcutaneous   Inject 60 mg into the skin every 6 (six) months. Administer in upper arm, thigh, or abdomen         .           Marland Kitchen Multiple Vitamins-Minerals (MULTIVITAMIN WITH MINERALS) tablet   Oral   Take 1 tablet by mouth daily.         . Omega 3 1000 MG CAPS   Oral   Take 2 capsules by mouth daily at 6 (six) AM.         . sodium chloride (OCEAN) 0.65 % SOLN nasal spray   Each Nare   Place 2 sprays into both nostrils every 2 (two) hours while awake.      0   .           . vitamin E 1000 UNIT capsule   Oral   Take 1,000 Units by mouth daily.           Allergies Erythromycin base  Family History  Problem Relation Age of Onset  . Diabetes Father     Social History Social History  Substance Use Topics  . Smoking status: Never Smoker   . Smokeless tobacco: None  .  Alcohol Use: No    Review of Systems Constitutional: No fever/chills Eyes: No visual changes. ENT: No sore throat. Cardiovascular: Denies chest pain. Respiratory: Denies shortness of breath. Gastrointestinal: No abdominal pain.  No nausea, no vomiting.  No diarrhea.  No constipation. Genitourinary: Negative for dysuria. Musculoskeletal: Negative for back pain. positive left foot pain.  Skin: Negative for rash. Neurological: Negative for headaches, focal weakness or numbness.  10-point ROS otherwise negative.  ____________________________________________   PHYSICAL EXAM:  VITAL SIGNS: ED Triage Vitals  Enc Vitals Group     BP 08/11/15 0849 113/61 mmHg     Pulse Rate 08/11/15 0849 62     Resp 08/11/15 0849 16     Temp 08/11/15 0849 97.4 F (36.3 C)     Temp Source 08/11/15 0849 Tympanic     SpO2 08/11/15 0849 100 %     Weight 08/11/15 0849 128 lb (58.06 kg)     Height 08/11/15 0849 5\' 6"  (1.676 m)     Head Cir  --      Peak Flow --      Pain Score 08/11/15 0851 4     Pain Loc --      Pain Edu? --      Excl. in GC? --     Constitutional: Alert and oriented. Well appearing and in no acute distress. Eyes: Conjunctivae are normal. PERRL. EOMI. Head: Atraumatic.  Mouth/Throat: Mucous membranes are moist. Neck: No stridor.  No cervical spine tenderness to palpation. Cardiovascular: Normal rate, regular rhythm. Grossly normal heart sounds.  Good peripheral circulation. Respiratory: Normal respiratory effort.  No retractions. Lungs CTAB. Musculoskeletal: No lower or upper extremity tenderness nor edema.   Bilateral pedal pulses equal and easily palpated.  Except : Left mid dorsal to lateral dorsal foot at base of third fourth and fifth metatarsals mild tenderness to palpation with minimal swelling, no ecchymosis, skin intact, full range of motion, no pain with plantar flexion or dorsiflexion, neurovascular intact, no tendon or motor deficits, capillary refill time < 2seconds to all left foot distal toes. Gait not tested due to pain.  Neurologic:  Normal speech and language. No gross focal neurologic deficits are appreciated.  Skin:  Skin is warm, dry and intact. No rash noted. Psychiatric: Mood and affect are normal. Speech and behavior are normal.  ____________________________________________   LABS (all labs ordered are listed, but only abnormal results are displayed)  Labs Reviewed - No data to display  RADIOLOGY  EXAM: LEFT FOOT - COMPLETE 3+ VIEW  COMPARISON: None.  FINDINGS: No fracture or dislocation of mid foot or forefoot. The phalanges are normal. The calcaneus is normal. No soft tissue abnormality.  IMPRESSION: No fracture or dislocation.   Electronically Signed By: Genevive BiStewart Edmunds M.D. On: 08/11/2015 09:25       I, Renford DillsLindsey Roberts Bon, personally viewed and evaluated these images (plain radiographs) as part of my medical decision making, as well as reviewing the  written report by the radiologist.   PROCEDURES  Procedure(s) performed:  Denies need for splint or crutches. ____________________________________________   INITIAL IMPRESSION / ASSESSMENT AND PLAN / ED COURSE  Pertinent labs & imaging results that were available during my care of the patient were reviewed by me and considered in my medical decision making (see chart for details).  Well-appearing patient. No acute distress. Presenting for the complaints of left dorsal foot pain post mechanical injury last night. Denies any other pain or injury. Left foot x-ray no fracture or dislocation per radiologist.  Suspect contusion injury. Discussed with patient we'll treats supportively and symptomatically. Patient denies need for crutches or splint and states that she has a foot and ankle sleeve wrap at home that she will continue to use. Ice pack given. Encouraged to ice and elevate and rest. Rx for tramadol given when necessary. Follow-up with PCP or orthopedist as needed for continued pain. Discussed indication, risks and benefits of medications with patient.  Discussed follow up with Primary care physician this week. Discussed follow up and return parameters including no resolution or any worsening concerns. Patient verbalized understanding and agreed to plan.   ____________________________________________   FINAL CLINICAL IMPRESSION(S) / ED DIAGNOSES  Final diagnoses:  Foot contusion, left, initial encounter      Note: This dictation was prepared with Dragon dictation along with smaller phrase technology. Any transcriptional errors that result from this process are unintentional.    Renford Dills, NP 08/11/15 1104  Renford Dills, NP 08/11/15 [phone]

## 2015-08-11 NOTE — ED Notes (Signed)
Patient states that she slammed the car door on her left foot last night.  Patient c/o left ankle and left foot pain.

## 2015-09-08 ENCOUNTER — Telehealth: Payer: Self-pay

## 2015-09-08 NOTE — Telephone Encounter (Signed)
Patient wants Prilea Inj done here. Advised she needs to get copy of records and then we can write for it and have to go get from pharmacy and then bring here for her to give.

## 2015-09-20 ENCOUNTER — Encounter: Payer: Self-pay | Admitting: Internal Medicine

## 2015-09-20 ENCOUNTER — Other Ambulatory Visit: Payer: Self-pay | Admitting: Internal Medicine

## 2015-09-20 DIAGNOSIS — M810 Age-related osteoporosis without current pathological fracture: Secondary | ICD-10-CM

## 2015-09-20 MED ORDER — DENOSUMAB 60 MG/ML ~~LOC~~ SOLN: 60.0000 mg | SUBCUTANEOUS | Status: DC

## 2015-09-22 ENCOUNTER — Ambulatory Visit: Payer: Self-pay

## 2015-09-27 ENCOUNTER — Other Ambulatory Visit: Payer: Self-pay | Admitting: Internal Medicine

## 2015-09-27 ENCOUNTER — Other Ambulatory Visit: Payer: Self-pay

## 2015-09-28 ENCOUNTER — Telehealth: Payer: Self-pay

## 2015-09-28 ENCOUNTER — Other Ambulatory Visit: Payer: Self-pay

## 2015-09-28 DIAGNOSIS — M1990 Unspecified osteoarthritis, unspecified site: Secondary | ICD-10-CM

## 2015-09-28 MED ORDER — DENOSUMAB 60 MG/ML ~~LOC~~ SOLN: 60.0000 mg | SUBCUTANEOUS | Status: DC

## 2015-09-28 NOTE — Telephone Encounter (Signed)
[facility] Approval: #[id number] PROLIA INJ. I did also send to [employer] instead of [facility].

## 2015-10-04 ENCOUNTER — Other Ambulatory Visit: Payer: Self-pay | Admitting: Internal Medicine

## 2015-10-04 DIAGNOSIS — M810 Age-related osteoporosis without current pathological fracture: Secondary | ICD-10-CM

## 2015-10-04 NOTE — Telephone Encounter (Signed)
Insurance will not pay in full for the injection even though this PriorAuth was done. [facility] or where every you recommend.

## 2015-10-06 ENCOUNTER — Encounter: Payer: Self-pay | Admitting: Internal Medicine

## 2015-10-06 ENCOUNTER — Ambulatory Visit (INDEPENDENT_AMBULATORY_CARE_PROVIDER_SITE_OTHER): Payer: BLUE CROSS/BLUE SHIELD | Admitting: Internal Medicine

## 2015-10-06 VITALS — BP 126/81 | HR 76 | Resp 16 | Ht 66.0 in | Wt 132.0 lb

## 2015-10-06 DIAGNOSIS — M810 Age-related osteoporosis without current pathological fracture: Secondary | ICD-10-CM | POA: Diagnosis not present

## 2015-10-06 DIAGNOSIS — G4700 Insomnia, unspecified: Secondary | ICD-10-CM

## 2015-10-06 DIAGNOSIS — F329 Major depressive disorder, single episode, unspecified: Secondary | ICD-10-CM | POA: Diagnosis not present

## 2015-10-06 DIAGNOSIS — F32A Depression, unspecified: Secondary | ICD-10-CM

## 2015-10-06 MED ORDER — ZALEPLON 10 MG PO CAPS: 10.0000 mg | ORAL_CAPSULE | Freq: Every evening | ORAL | Status: AC | PRN

## 2015-10-06 MED ORDER — CITALOPRAM HYDROBROMIDE 20 MG PO TABS: 20.0000 mg | ORAL_TABLET | Freq: Every day | ORAL | Status: DC

## 2015-10-06 MED ORDER — ALENDRONATE SODIUM 70 MG PO TABS: 70.0000 mg | ORAL_TABLET | ORAL | Status: DC

## 2015-10-06 NOTE — Progress Notes (Signed)
Date:  10/06/2015   Name:  Maximiano CossShirley Diane Rindfleisch   DOB:  01/30/1956   MRN:  [id number]   Chief Complaint: Depression Depression        This is a new problem.  The current episode started 1 to 4 weeks ago.   The onset quality is gradual.   The problem has been gradually improving since onset.  Associated symptoms include fatigue.  Associated symptoms include no suicidal ideas.  Past treatments include SSRIs - Selective serotonin reuptake inhibitors. She was seen by an Urgent mental clinic and started on Celexa.  Already she feels better.  She is very stressed over the upcoming move to [place].  Osteoporosis - we can not get Prolia approved so she wants to go back to Fosamax.  She reported jaw clicking with Fosamax in the past - the reason she stopped it. She thinks there may be a problem with the insurance information and asks if we can try one more time.  Insomnia -  Tried temazepam but too addictive.  Has trouble falling asleep and staying asleep.  Only gets about 4-5 hours per night.  Sleep hygiene is poor.  Appropriate sleep hygiene discussed.  Review of Systems  Constitutional: Positive for fatigue. Negative for fever, chills and unexpected weight change.  HENT: Negative for tinnitus and trouble swallowing.   Respiratory: Negative for cough, shortness of breath and wheezing.   Cardiovascular: Negative for chest pain, palpitations and leg swelling.  Gastrointestinal: Negative for abdominal pain.  Musculoskeletal: Negative for joint swelling, arthralgias and gait problem.  Skin: Negative for rash.  Neurological: Negative for dizziness and syncope.  Hematological: Negative for adenopathy.  Psychiatric/Behavioral: Positive for depression, sleep disturbance and dysphoric mood. Negative for suicidal ideas. The patient is nervous/anxious.     Patient Active Problem List   Diagnosis Date Noted  . Dry eye syndrome 03/10/2015  . Depression 03/10/2015  . Scoliosis of lumbar spine 03/10/2015  .  Hematuria 12/23/2014  . Chronic constipation 12/16/2014  . Ankle arthralgia 12/16/2014  . 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 Taking medication for chronic disease 11/18/2014  . Vitamin D deficiency 11/18/2014    Prior to Admission medications   Medication Sig Start Date End Date Taking? Authorizing Provider  Cholecalciferol (D-5000) 5000 UNITS TABS Take by mouth.    Historical Provider, MD  citalopram (CELEXA) 20 MG tablet Take 1 tablet by mouth daily. 09/21/15   Historical Provider, MD  denosumab (PROLIA) 60 MG/ML SOLN injection Inject 60 mg into the skin every 6 (six) months. Administer in upper arm, thigh, or abdomen 09/28/15   Reubin MilanLaura H Elizabeth Haff, MD  doxycycline (MONODOX) 50 MG capsule Take 50 mg by mouth 2 (two) times daily.    Historical Provider, MD  Multiple Vitamins-Minerals (MULTIVITAMIN WITH MINERALS) tablet Take 1 tablet by mouth daily.    Historical Provider, MD  Omega 3 1000 MG CAPS Take 2 capsules by mouth daily at 6 (six) AM.    Historical Provider, MD  sodium chloride (OCEAN) 0.65 % SOLN nasal spray Place 2 sprays into both nostrils every 2 (two) hours while awake. 08/05/15   Barbaraann Barthelina A Betancourt, NP  traMADol (ULTRAM) 50 MG tablet Take 1 tablet (50 mg total) by mouth every 8 (eight) hours as needed (Do not drive or operate machinery while taking as can cause drowsiness.). 08/11/15   Renford DillsLindsey Miller, NP  vitamin E 1000 UNIT capsule Take 1,000 Units by mouth daily.    Historical Provider, MD    Allergies  Allergen Reactions  . Codeine Other (See Comments)  . Erythromycin Base Rash    Past Surgical History  Procedure Laterality Date  . Skin cancer excision    . Colonoscopy  2006    Social History  Substance Use Topics  . Smoking status: Never Smoker   . Smokeless tobacco: None  . Alcohol Use: No     Medication list has been reviewed and updated.   Physical Exam  Constitutional: She is oriented to person, place, and time. She appears well-developed. No distress.   HENT:  Head: Normocephalic and atraumatic.  Pulmonary/Chest: Effort normal. No respiratory distress.  Musculoskeletal: Normal range of motion.  Neurological: She is alert and oriented to person, place, and time.  Skin: Skin is warm and dry. No rash noted.  Psychiatric: She has a normal mood and affect. Her behavior is normal. Thought content normal.  Nursing note and vitals reviewed.   BP 126/81 mmHg  Pulse 76  Resp 16  Ht  (1.676 m)  Wt 132 lb (59.875 kg)  BMI 21.32 kg/m2  SpO2 98%  Assessment and Plan: 1. Depression Continue celexa - citalopram (CELEXA) 20 MG tablet; Take 1 tablet (20 mg total) by mouth daily.  Dispense: 30 tablet; Refill: 2  2. Postmenopausal osteoporosis Doubt we can get Prolia - will start Fosamax - alendronate (FOSAMAX) 70 MG tablet; Take 1 tablet (70 mg total) by mouth every 7 (seven) days. Take with a full glass of water on an empty stomach.  Dispense: 4 tablet; Refill: 2  3. Insomnia Pt to attempt sleep on her own - can take Sonata within 4 hours of needed to awaken PRN - zaleplon (SONATA) 10 MG capsule; Take 1 capsule (10 mg total) by mouth at bedtime as needed for sleep.  Dispense: 30 capsule; Refill: 2   Bari Edward, MD [facility]  10/06/2015

## 2015-10-10 ENCOUNTER — Telehealth: Payer: Self-pay

## 2015-10-10 NOTE — Telephone Encounter (Signed)
Called ins and the Prolia is approved but patient must call and finish enrolling [phone]

## 2015-10-11 ENCOUNTER — Telehealth: Payer: Self-pay

## 2015-10-11 NOTE — Telephone Encounter (Signed)
I approve this approach.  Need to make sure that [employer] only sends the one injection with NO REFILLS otherwise we will get it again in 6 months.

## 2015-10-11 NOTE — Telephone Encounter (Signed)
[employer] has asked patient to have injection ordered be mailed to clinic administering. What should I do? I explained in beginning that we can not hold this in our office given the cost and other reasons. She just left message asking me to call them and approve this so they will hold off sending it.

## 2015-10-12 NOTE — Telephone Encounter (Signed)
Pharmacy to send on October 17 2015. I will call her when I have this in hand so that we can schedule Nurse Visit.

## 2015-10-19 ENCOUNTER — Other Ambulatory Visit: Payer: Self-pay | Admitting: Internal Medicine

## 2015-10-19 ENCOUNTER — Ambulatory Visit (INDEPENDENT_AMBULATORY_CARE_PROVIDER_SITE_OTHER): Payer: BLUE CROSS/BLUE SHIELD

## 2015-10-19 ENCOUNTER — Telehealth: Payer: Self-pay

## 2015-10-19 VITALS — Resp 16 | Ht 66.0 in | Wt 132.0 lb

## 2015-10-19 DIAGNOSIS — F329 Major depressive disorder, single episode, unspecified: Secondary | ICD-10-CM

## 2015-10-19 DIAGNOSIS — M810 Age-related osteoporosis without current pathological fracture: Secondary | ICD-10-CM | POA: Diagnosis not present

## 2015-10-19 DIAGNOSIS — F32A Depression, unspecified: Secondary | ICD-10-CM

## 2015-10-19 MED ORDER — DENOSUMAB 60 MG/ML ~~LOC~~ SOLN
60.0000 mg | Freq: Once | SUBCUTANEOUS | Status: AC
  Administered 2015-10-19: 60 mg via SUBCUTANEOUS

## 2015-10-19 MED ORDER — CITALOPRAM HYDROBROMIDE 20 MG PO TABS: 20.0000 mg | ORAL_TABLET | Freq: Every day | ORAL | 1 refills | Status: DC

## 2015-10-19 NOTE — Telephone Encounter (Signed)
She just received the Prolia which is good for 6 months.  Therefore, she does not need the Fosamax.  By the time the Prolia is due to be re-dosed, she will have another physician who can prescribe alternative medication.

## 2015-10-19 NOTE — Progress Notes (Signed)
Patient came for Prolia Injection   Shipped to our office on: 10/17/2015  Lot: [id number] Exp: 10/19  Administered in Left Arm Subcutaneous  Patient waited 10 minutes and responded well with  No concerns.  Patient will not receive any additional Prolia Inj as she is moving and has been given the documents needed to reorder and have delivered for future.   Next Injection Due: 04/20/2016  Clayborne Artist CMA

## 2015-10-19 NOTE — Telephone Encounter (Signed)
Patient would like Paper RX mailed to [address] [place] [address] for Celexa 20mg  90 day supply and Fosamax 70mg  30 day supply INCASE she can not get the Prolia for any reasons. Advised NEVER to take Fosamax with Prolia and patient understands but is concerned due to all that we went through for this injection. The injection was authorized x 1 year but she said that her Insurance and her address is about to change.

## 2015-10-20 NOTE — Telephone Encounter (Signed)
Sent letter explaining this

## 2015-11-10 ENCOUNTER — Other Ambulatory Visit: Payer: Self-pay

## 2015-11-10 ENCOUNTER — Telehealth: Payer: Self-pay

## 2015-11-10 DIAGNOSIS — F329 Major depressive disorder, single episode, unspecified: Secondary | ICD-10-CM

## 2015-11-10 DIAGNOSIS — F32A Depression, unspecified: Secondary | ICD-10-CM

## 2015-11-10 MED ORDER — CITALOPRAM HYDROBROMIDE 20 MG PO TABS: 20.0000 mg | ORAL_TABLET | Freq: Every day | ORAL | 0 refills | Status: AC

## 2015-11-10 MED ORDER — CITALOPRAM HYDROBROMIDE 20 MG PO TABS: 20.0000 mg | ORAL_TABLET | Freq: Every day | ORAL | 2 refills | Status: DC

## 2015-11-24 ENCOUNTER — Ambulatory Visit: Payer: Self-pay | Admitting: Internal Medicine

## 2015-11-29 NOTE — Telephone Encounter (Signed)
We will not RX anymore injections

## 2015-12-09 ENCOUNTER — Ambulatory Visit
Admission: EM | Admit: 2015-12-09 | Discharge: 2015-12-09 | Disposition: A | Discharge status: Home / Self Care | Payer: BLUE CROSS/BLUE SHIELD | Attending: Family Medicine | Admitting: Family Medicine

## 2015-12-09 DIAGNOSIS — J0100 Acute maxillary sinusitis, unspecified: Secondary | ICD-10-CM | POA: Diagnosis not present

## 2015-12-09 MED ORDER — AZITHROMYCIN 250 MG PO TABS: 250.0000 mg | ORAL_TABLET | Freq: Every day | ORAL | 0 refills | Status: DC

## 2015-12-09 NOTE — ED Provider Notes (Signed)
[facility]    CSN: [id number] Arrival date & time: 12/09/15  [id number]  First Provider Contact:  First MD Initiated Contact with Patient 12/09/15 0825        History   Chief Complaint Chief Complaint  Patient presents with  . Recurrent Sinusitis    HPI Heather Sexton is a 60 y.o. female.   Sore Throat  This is a new problem. The current episode started more than 1 week ago (sinus congestion with sore throat). The problem occurs hourly. The problem has been gradually worsening. Associated symptoms include headaches. Pertinent negatives include no shortness of breath. Nothing aggravates the symptoms.  URI  Presenting symptoms: congestion, facial pain and sore throat   Presenting symptoms: no cough, no ear pain, no fever and no rhinorrhea   Ineffective treatments: flonase. Associated symptoms: headaches   Associated symptoms: no wheezing     Past Medical History:  Diagnosis Date  . Osteoporosis     Patient Active Problem List   Diagnosis Date Noted  . Dry eye syndrome 03/10/2015  . Depression 03/10/2015  . Scoliosis of lumbar spine 03/10/2015  . Hematuria 12/23/2014  . Chronic constipation 12/16/2014  . Ankle arthralgia 12/16/2014  . 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 Taking medication for chronic disease 11/18/2014  . Vitamin D deficiency 11/18/2014    Past Surgical History:  Procedure Laterality Date  . COLONOSCOPY  2006  . SKIN CANCER EXCISION      OB History    No data available       Home Medications    Prior to Admission medications   Medication Sig Start Date End Date Taking? Authorizing Provider  Cholecalciferol (VITAMIN D3) 5000 units TABS Take by mouth.   Yes Historical Provider, MD  citalopram (CELEXA) 20 MG tablet Take 1 tablet (20 mg total) by mouth daily. 11/10/15  Yes Reubin MilanLaura H Berglund, MD  cycloSPORINE (RESTASIS) 0.05 % ophthalmic emulsion Apply to eye. 10/11/15  Yes Historical Provider, MD  denosumab (PROLIA) 60 MG/ML  SOLN injection Inject into the skin.   Yes Historical Provider, MD  doxycycline (MONODOX) 50 MG capsule Take by mouth. 10/11/15 01/09/16 Yes Historical Provider, MD  gabapentin (NEURONTIN) 100 MG capsule Take 100 mg by mouth. 02/03/15  Yes Historical Provider, MD  Lifitegrast Benay Spice(XIIDRA) 5 % SOLN  05/19/15  Yes Historical Provider, MD  Multiple Vitamins-Minerals (MULTIVITAMIN WITH MINERALS) tablet Take by mouth.   Yes Historical Provider, MD  Omega 3 1000 MG CAPS Take 2 capsules by mouth daily at 6 (six) AM.   Yes Historical Provider, MD  Omega-3 Fatty Acids (FISH OIL PO) Take by mouth.   Yes Historical Provider, MD  sodium chloride (OCEAN) 0.65 % SOLN nasal spray Place 2 sprays into both nostrils every 2 (two) hours while awake. 08/05/15  Yes Barbaraann Barthelina A Betancourt, NP  vitamin E 1000 UNIT capsule Take 1,000 Units by mouth daily.   Yes Historical Provider, MD  zaleplon (SONATA) 10 MG capsule Take 1 capsule (10 mg total) by mouth at bedtime as needed for sleep. 10/06/15  Yes Reubin MilanLaura H Berglund, MD  azithromycin (ZITHROMAX) 250 MG tablet Take 1 tablet (250 mg total) by mouth daily. Take first 2 tablets together, then 1 every day until finished. 12/09/15   Duanne Limerickeanna C Luan Maberry, MD    Family History Family History  Problem Relation Age of Onset  . Diabetes Father     Social History Social History  Substance Use Topics  . Smoking status: Never Smoker  . Smokeless  tobacco: Never Used  . Alcohol use No     Allergies   Review of patient's allergies indicates no known allergies.   Review of Systems Review of Systems  Constitutional: Negative for fever.  HENT: Positive for congestion, postnasal drip, sinus pressure and sore throat. Negative for ear discharge, ear pain, facial swelling and rhinorrhea.   Eyes: Positive for redness.  Respiratory: Negative for cough, shortness of breath and wheezing.   Neurological: Positive for headaches.     Physical Exam Triage Vital Signs ED Triage Vitals  Enc Vitals  Group     BP      Pulse      Resp      Temp      Temp src      SpO2      Weight      Height      Head Circumference      Peak Flow      Pain Score      Pain Loc      Pain Edu?      Excl. in GC?    No data found.   Updated Vital Signs BP 100/69 (BP Location: Right Arm)   Pulse 63   Temp 97.9 F (36.6 C) (Oral)   Resp 18   Ht 5\' 6"  (1.676 m)   Wt 130 lb (59 kg)   SpO2 99%   BMI 20.98 kg/m   Visual Acuity Right Eye Distance:   Left Eye Distance:   Bilateral Distance:    Right Eye Near:   Left Eye Near:    Bilateral Near:     Physical Exam  Constitutional: She appears well-developed.  HENT:  Head: Normocephalic.  Right Ear: External ear normal.  Left Ear: External ear normal.  Nose: Nose normal.  Mouth/Throat: No oropharyngeal exudate.  Eyes: Pupils are equal, round, and reactive to light.  Conjunctiva erythematous  Neck: Normal range of motion.  Cardiovascular: Normal rate, regular rhythm and normal heart sounds.   No murmur heard. Pulmonary/Chest: Breath sounds normal. No respiratory distress. She has no wheezes.  Abdominal: Soft. There is no tenderness.  Lymphadenopathy:    She has no cervical adenopathy.  Skin: Skin is warm and dry.  Psychiatric: She has a normal mood and affect.     UC Treatments / Results  Labs (all labs ordered are listed, but only abnormal results are displayed) Labs Reviewed - No data to display  EKG  EKG Interpretation None       Radiology No results found.  Procedures Procedures (including critical care time)  Medications Ordered in UC Medications - No data to display   Initial Impression / Assessment and Plan / UC Course  I have reviewed the triage vital signs and the nursing notes.  Pertinent labs & imaging results that were available during my care of the patient were reviewed by me and considered in my medical decision making (see chart for details).  Clinical Course    Final Clinical  Impressions(s) / UC Diagnoses   Final diagnoses:  Acute maxillary sinusitis, recurrence not specified    New Prescriptions New Prescriptions   AZITHROMYCIN (ZITHROMAX) 250 MG TABLET    Take 1 tablet (250 mg total) by mouth daily. Take first 2 tablets together, then 1 every day until finished.     Duanne Limerick, MD 12/09/15 [phone]

## 2015-12-09 NOTE — ED Triage Notes (Signed)
Patient c/o sinus headache for about 2 weeks, and now she has stuffy head and congestion.

## 2016-03-14 ENCOUNTER — Other Ambulatory Visit: Payer: Self-pay | Admitting: Internal Medicine

## 2016-04-22 ENCOUNTER — Other Ambulatory Visit: Payer: Self-pay | Admitting: Internal Medicine

## 2016-04-22 DIAGNOSIS — M1990 Unspecified osteoarthritis, unspecified site: Secondary | ICD-10-CM

## 2016-05-09 ENCOUNTER — Other Ambulatory Visit: Payer: Self-pay | Admitting: Internal Medicine

## 2016-05-09 DIAGNOSIS — M1990 Unspecified osteoarthritis, unspecified site: Secondary | ICD-10-CM

## 2017-09-05 IMAGING — US US RENAL
1 series · 13 of 25 positions shown · non-contrast
Comparison: None.

CLINICAL DATA: Hematuria. History of kidney stones. Initial
encounter.

EXAM:
RENAL / URINARY TRACT ULTRASOUND COMPLETE

[Series 1: us renal · 0.20mm/px · 13 of 98 slices shown]
[im 1/98]
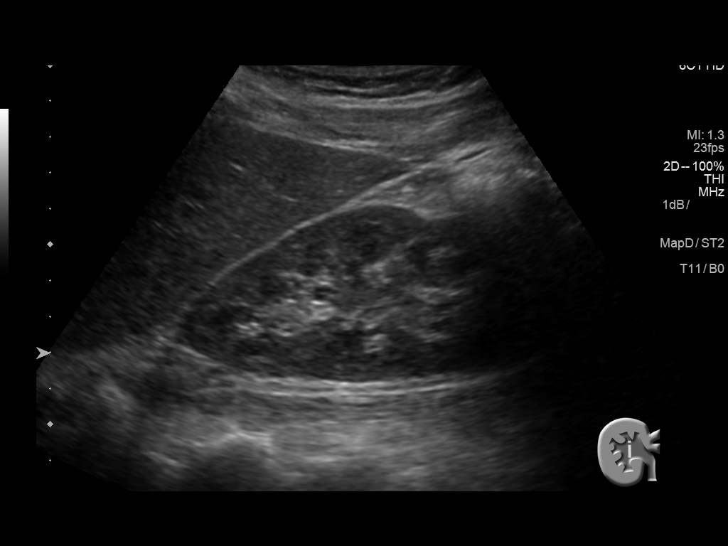
[im 9/98]
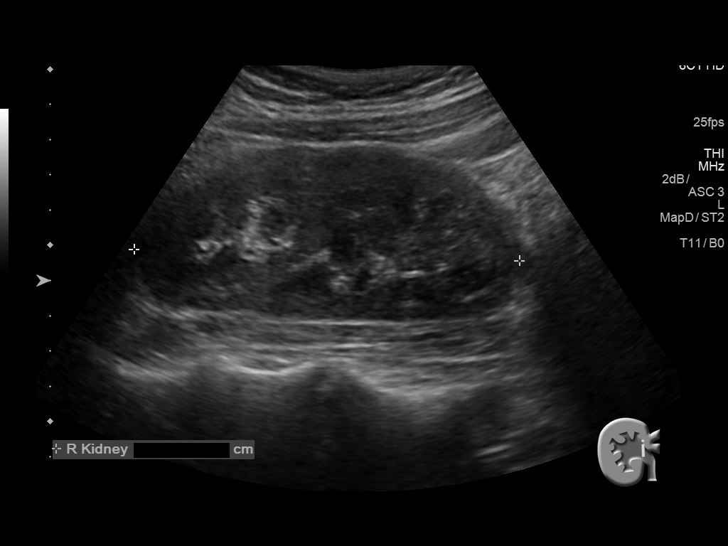
[im 17/98]
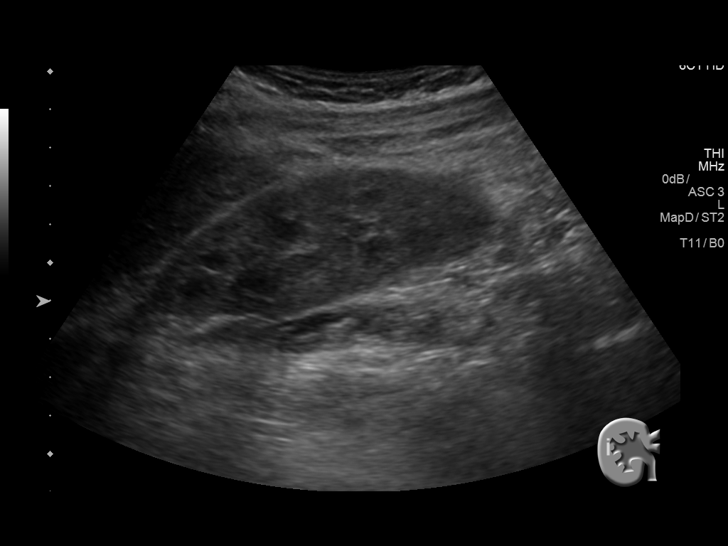
[im 25/98]
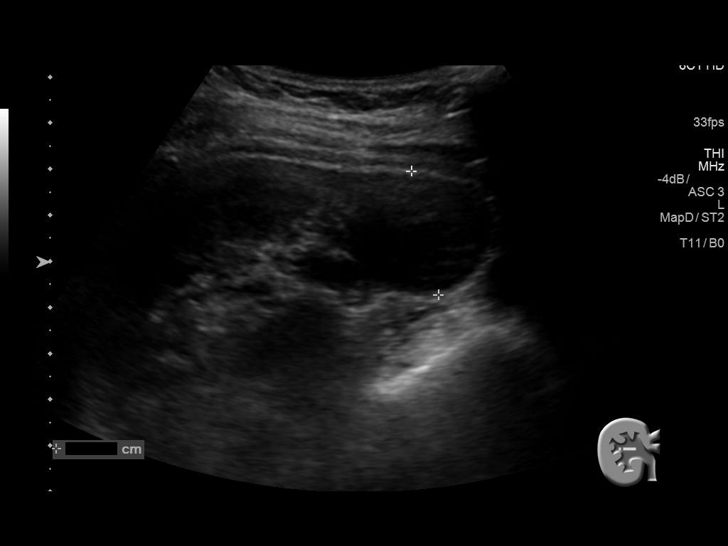
[im 33/98]
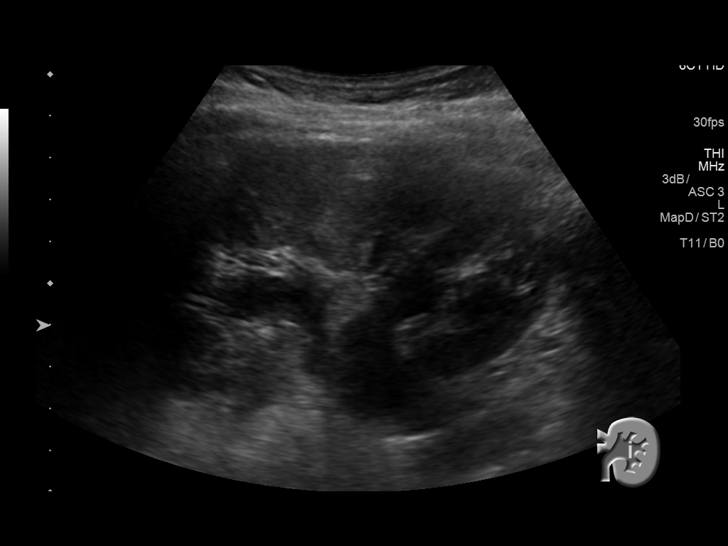
[im 41/98]
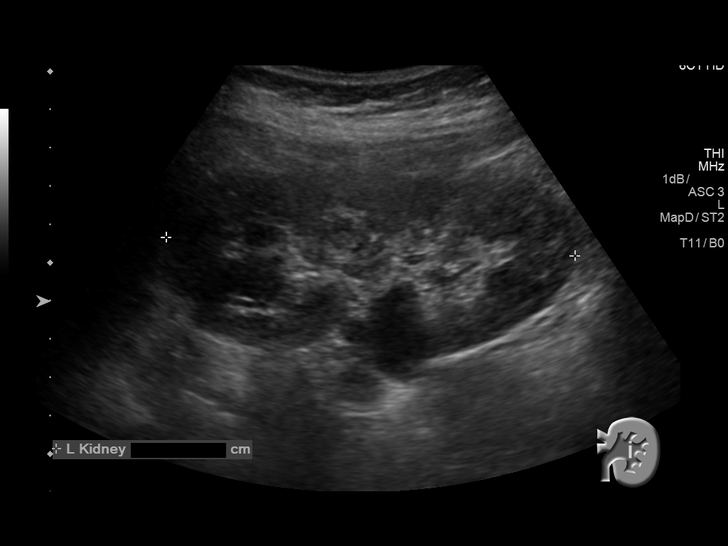
[im 49/98]
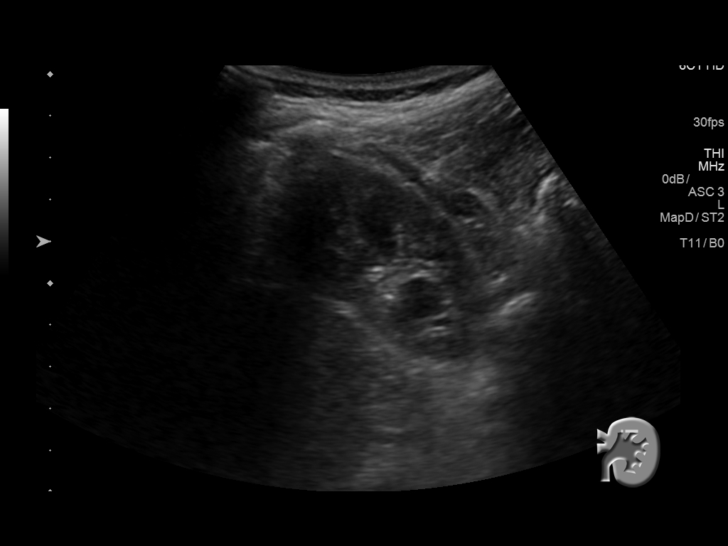
[im 57/98]
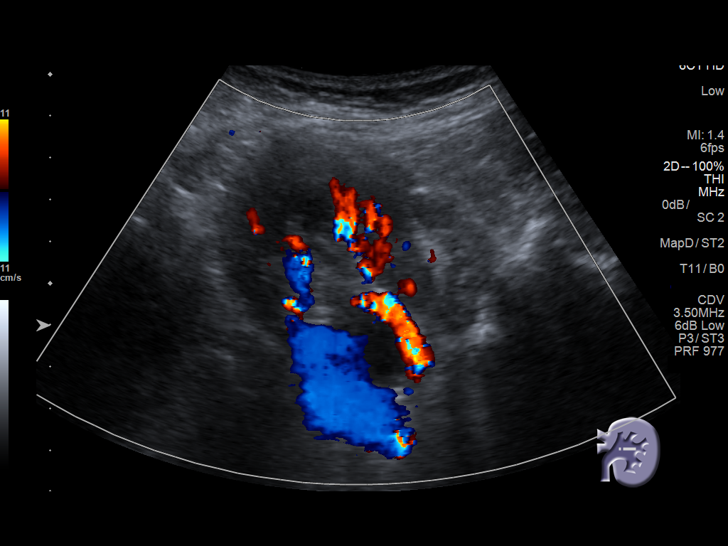
[im 65/98]
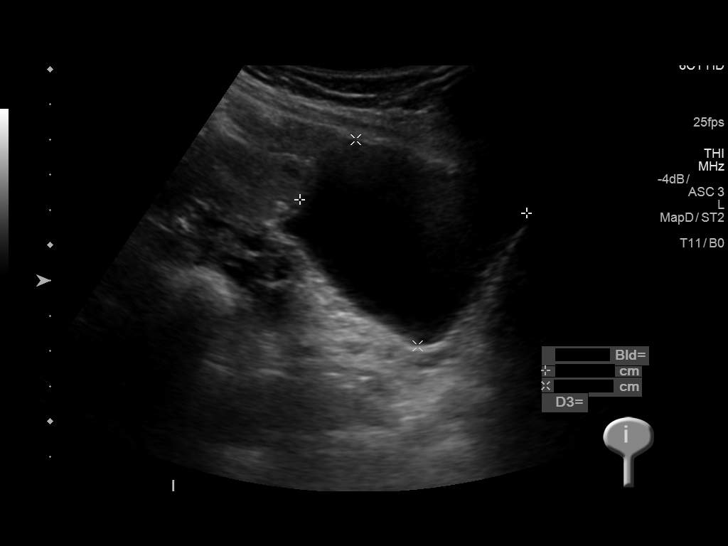
[im 73/98]
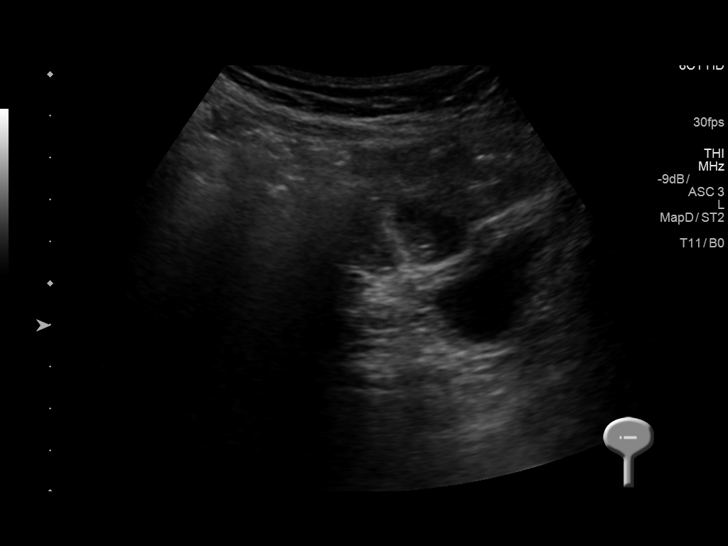
[im 81/98]
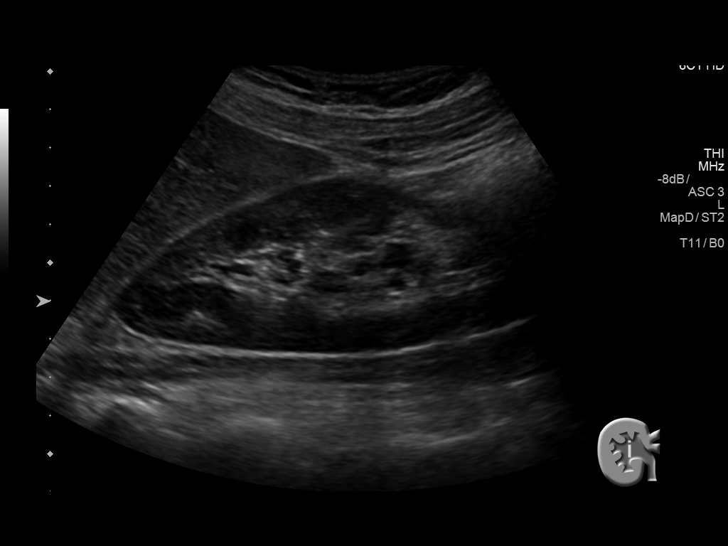
[im 89/98]
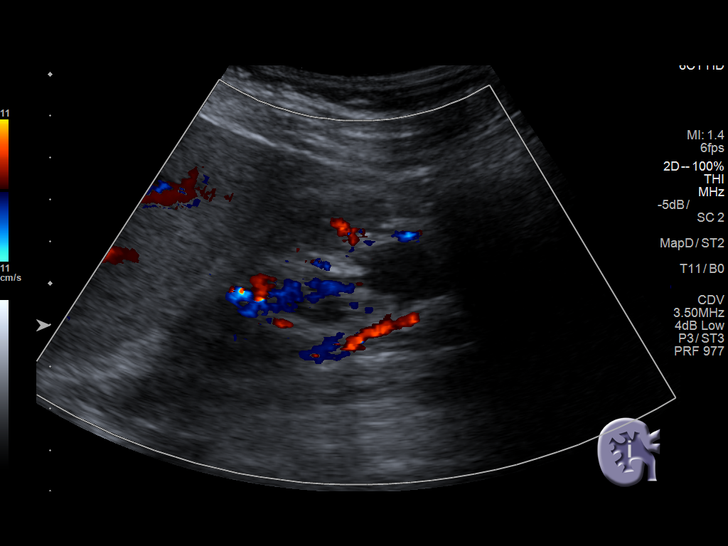
[im 98/98]
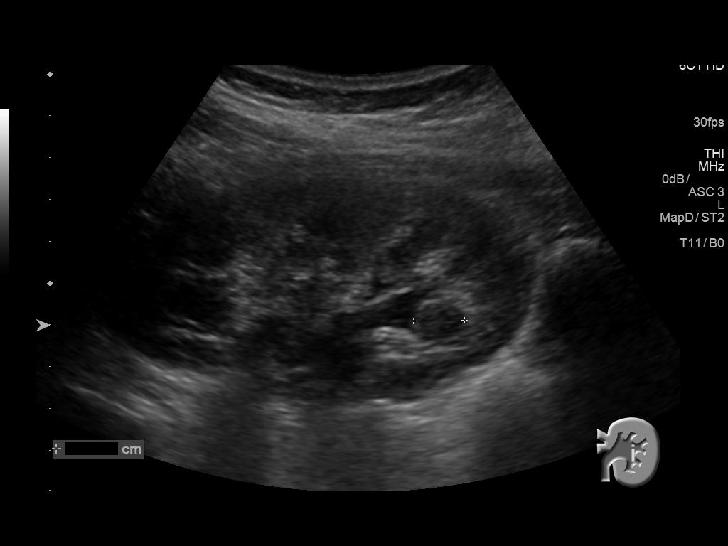

[13 of 25 positions shown; findings below may reference images not displayed]

FINDINGS: Right Kidney:

Length: 10.9 cm. There is a prominent extrarenal pelvis with minimal
prominence of the intrarenal collecting system. No cortical lesion
or calculus identified.

Left Kidney:

Length: 10.7 cm. There is a small extrarenal pelvis with mild
dilatation of the intrarenal collecting system. No urinary tract
calculi are demonstrated. However, there is possible clot or debris
within the lower pole calices, non shadowing. No abnormal blood flow
seen in this area on color Doppler to suggest tumor. No evidence of
cortical lesion.

Bladder:

Bilateral ureteral jets are noted. Pre void bladder volume is 192
ml. Postvoid, the bladder volume is 18 ml.
IMPRESSION: 1. Bilateral extrarenal pelves (larger on the right) and mild
intrarenal collecting system dilatation (greater on the left). No
high-grade ureteral obstruction identified. There are bilateral
ureteral jets in the bladder.
2. Possible blood clot or debris in the lower pole calices of the
left kidney. No discrete calculi or masses visualized.
3. If the patient has persistent unexplained hematuria, further
evaluation recommended with CT without and with contrast.
